# Patient Record
Sex: Female | Born: 1990 | Race: White | Hispanic: No | Marital: Married | Smoking: Never smoker
Health system: Southern US, Community
[De-identification: ages and names within clinical notes are randomized; demographics above are authoritative.]

## PROBLEM LIST (undated history)

## (undated) DIAGNOSIS — G90A Postural orthostatic tachycardia syndrome (POTS): Secondary | ICD-10-CM

## (undated) DIAGNOSIS — I498 Other specified cardiac arrhythmias: Secondary | ICD-10-CM

## (undated) DIAGNOSIS — J45909 Unspecified asthma, uncomplicated: Secondary | ICD-10-CM

## (undated) HISTORY — DX: Other specified cardiac arrhythmias: I49.8

## (undated) HISTORY — DX: Postural orthostatic tachycardia syndrome (POTS): G90.A

## (undated) HISTORY — DX: Unspecified asthma, uncomplicated: J45.909

---

## 2018-09-24 IMAGING — DX XR LUMBAR SPINE 2-3 VIEWS
1 series · 3 of 3 positions shown · non-contrast
Comparison: none

LUMBAR SPINE RADIOGRAPH
INDICATION: lumbago

[Series 1254: AP · U · 0.19mm/px · 3 of 3 slices shown]
[im 1/3]
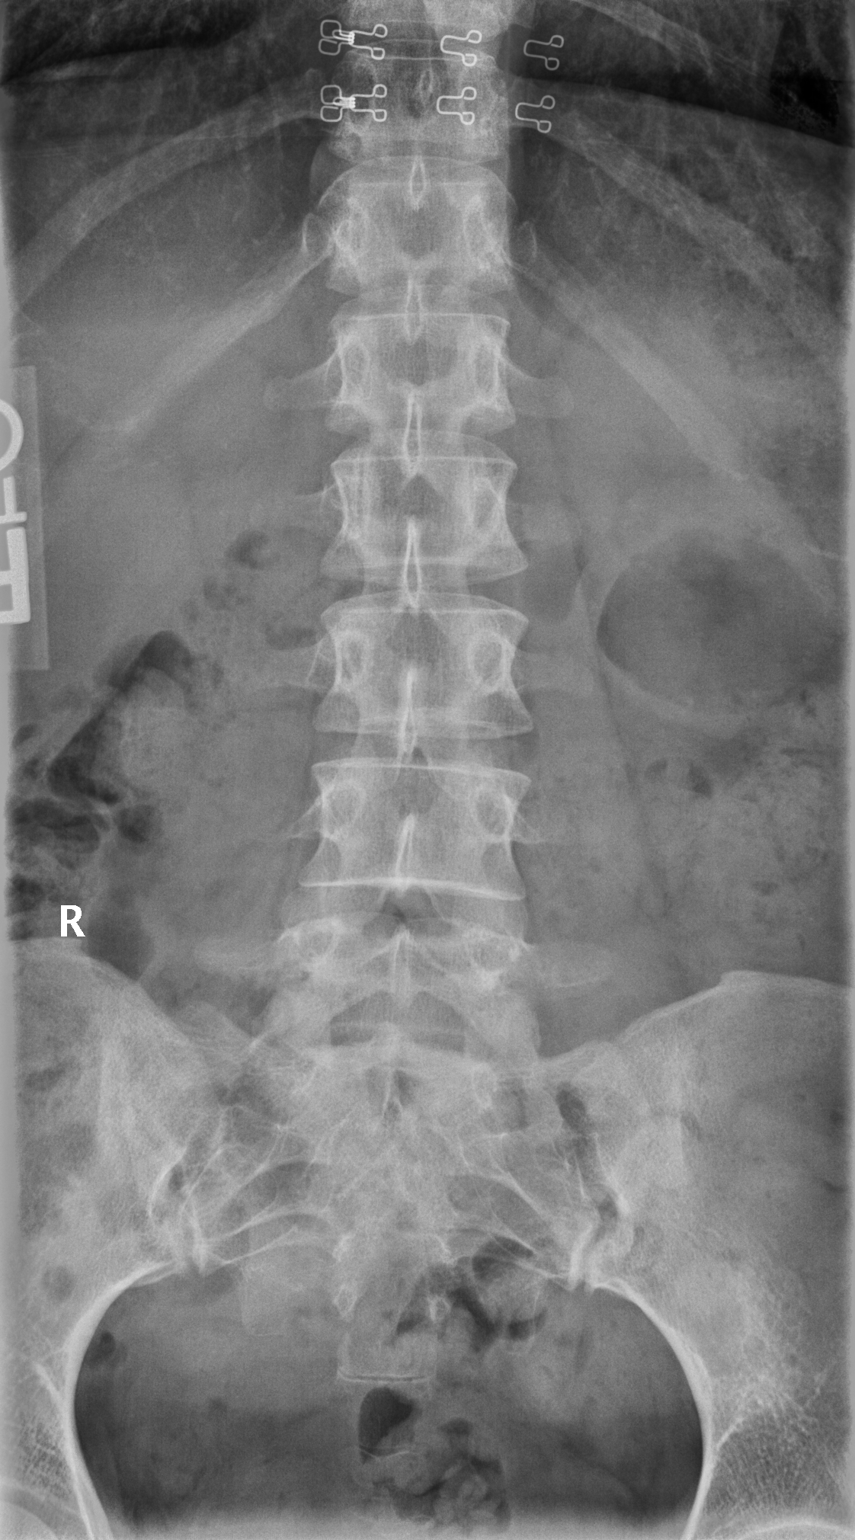
[im 2/3]
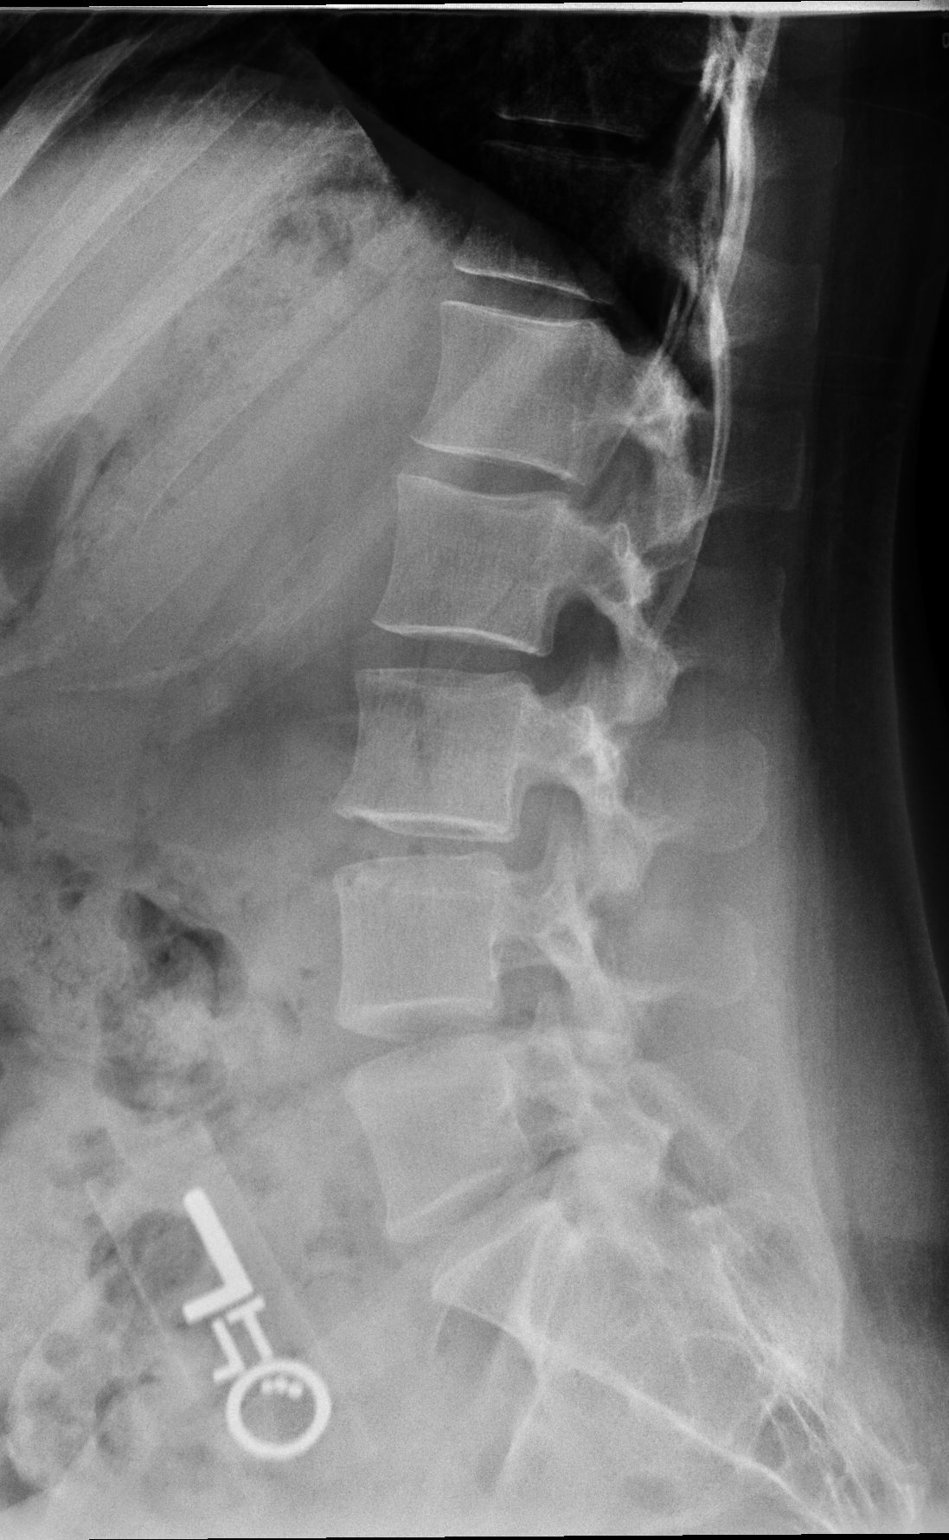
[im 3/3]
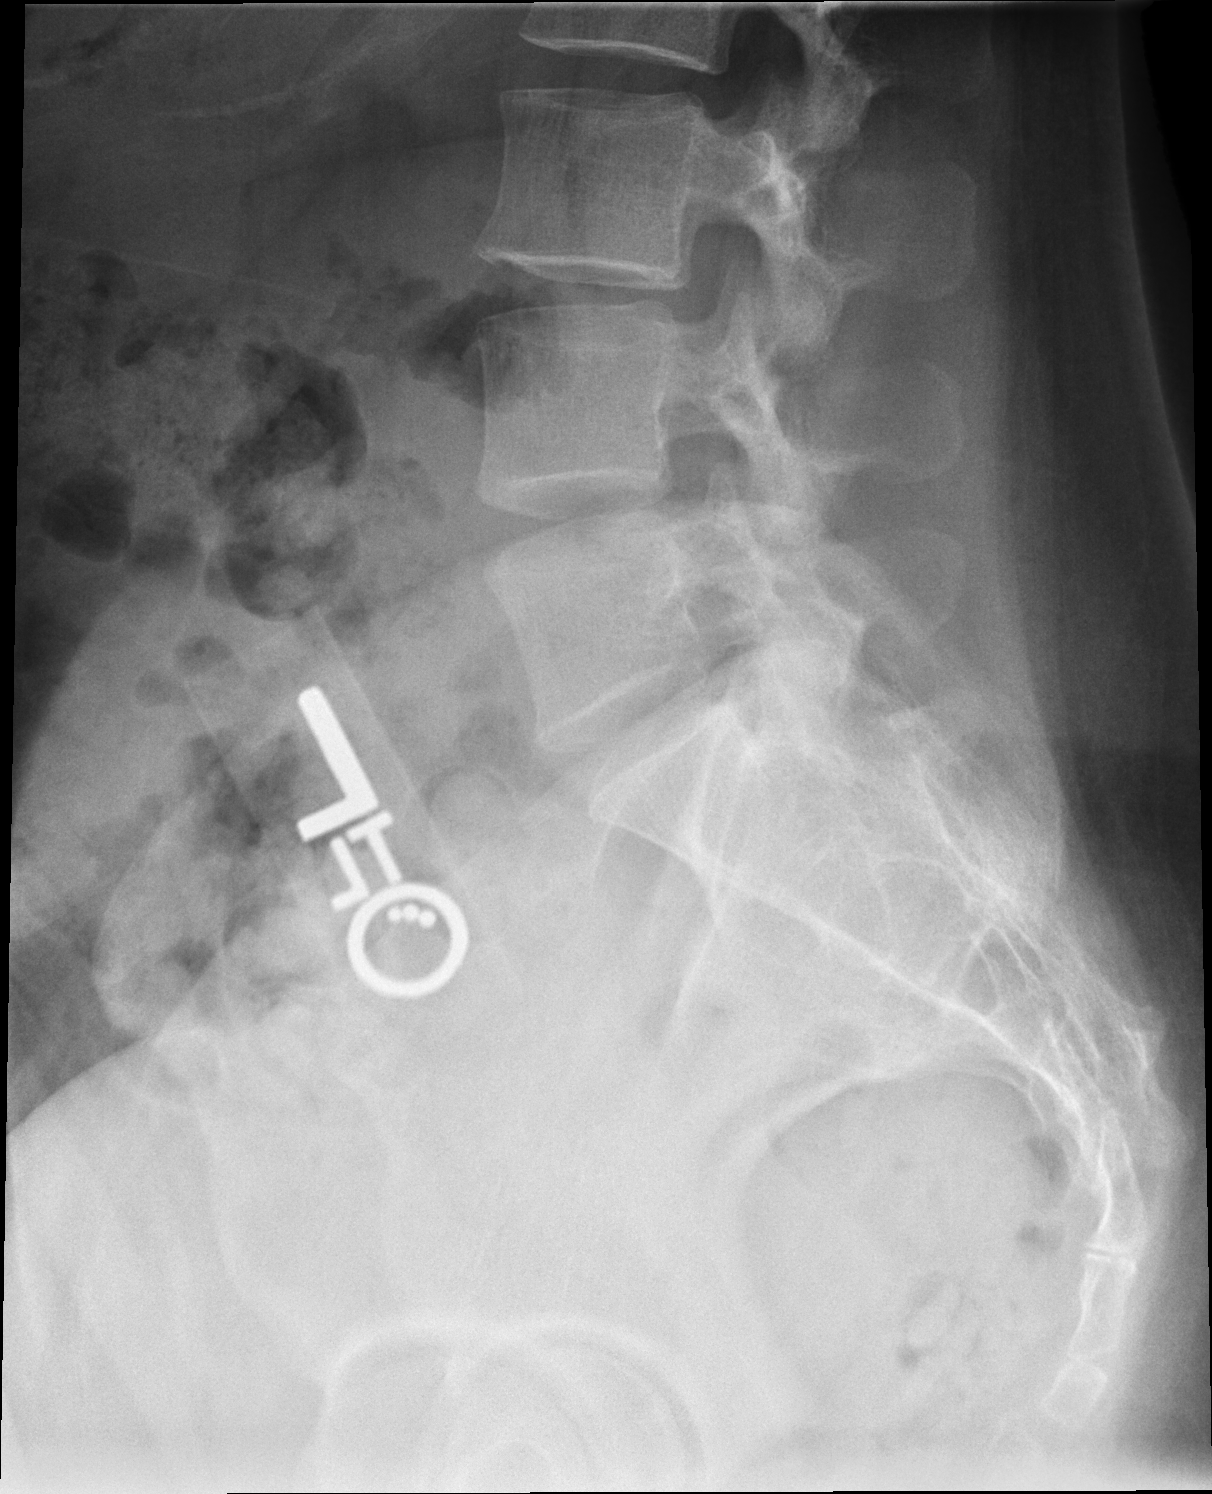

[3 of 3 positions shown; findings below may reference images not displayed]

FINDINGS: AP and lateral views of the lumbosacral spine are obtained and submitted for review. The vertebral body heights are maintained; no evidence for acute fractures. No subluxation. Disc space heights are maintained. Paraspinal soft tissues are radiographically unremarkable.
IMPRESSION: No acute or suspicious osseous abnormality.
Location code: 1
Is the patient pregnant?
No

## 2018-09-28 IMAGING — DX XR SCOLIOSIS 1 VW
1 series · 4 of 4 positions shown · non-contrast
Comparison: 11/09/2013, 09/24/2018.

SCOLIOSIS SERIES:
CLINICAL INDICATION: mild thoracic dextroscoliosis, eval for degree of curvature. also w/ mid thoracic back pain

[Series 1328: PA · U · 0.19mm/px · 4 of 4 slices shown]
[im 1/4]
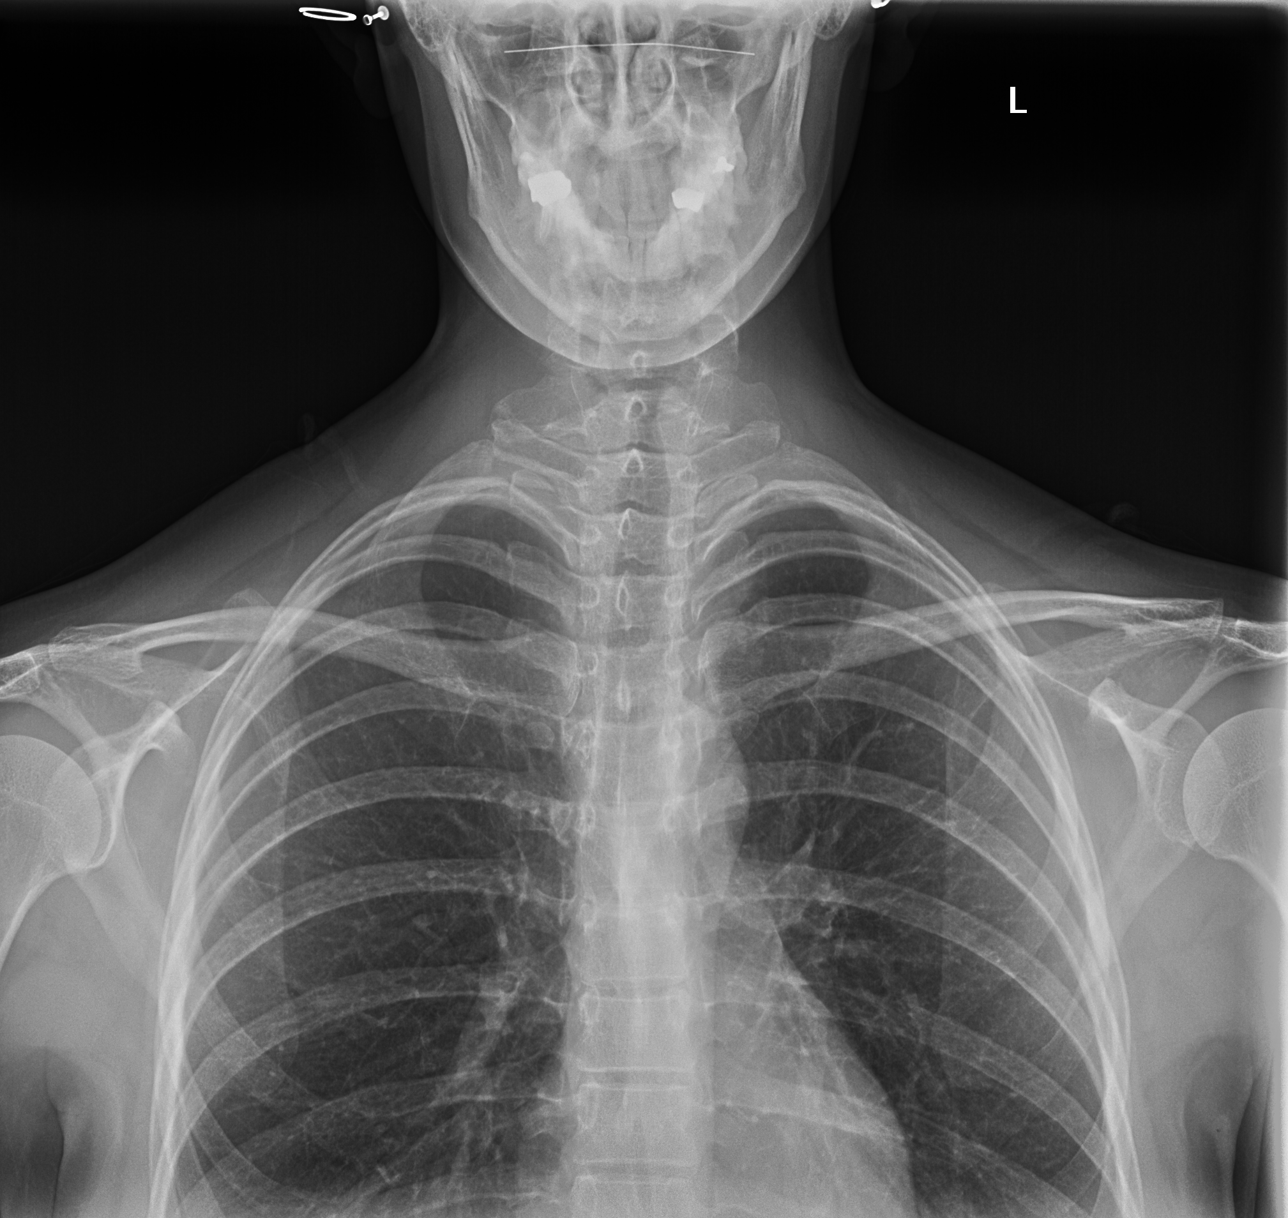
[im 2/4]
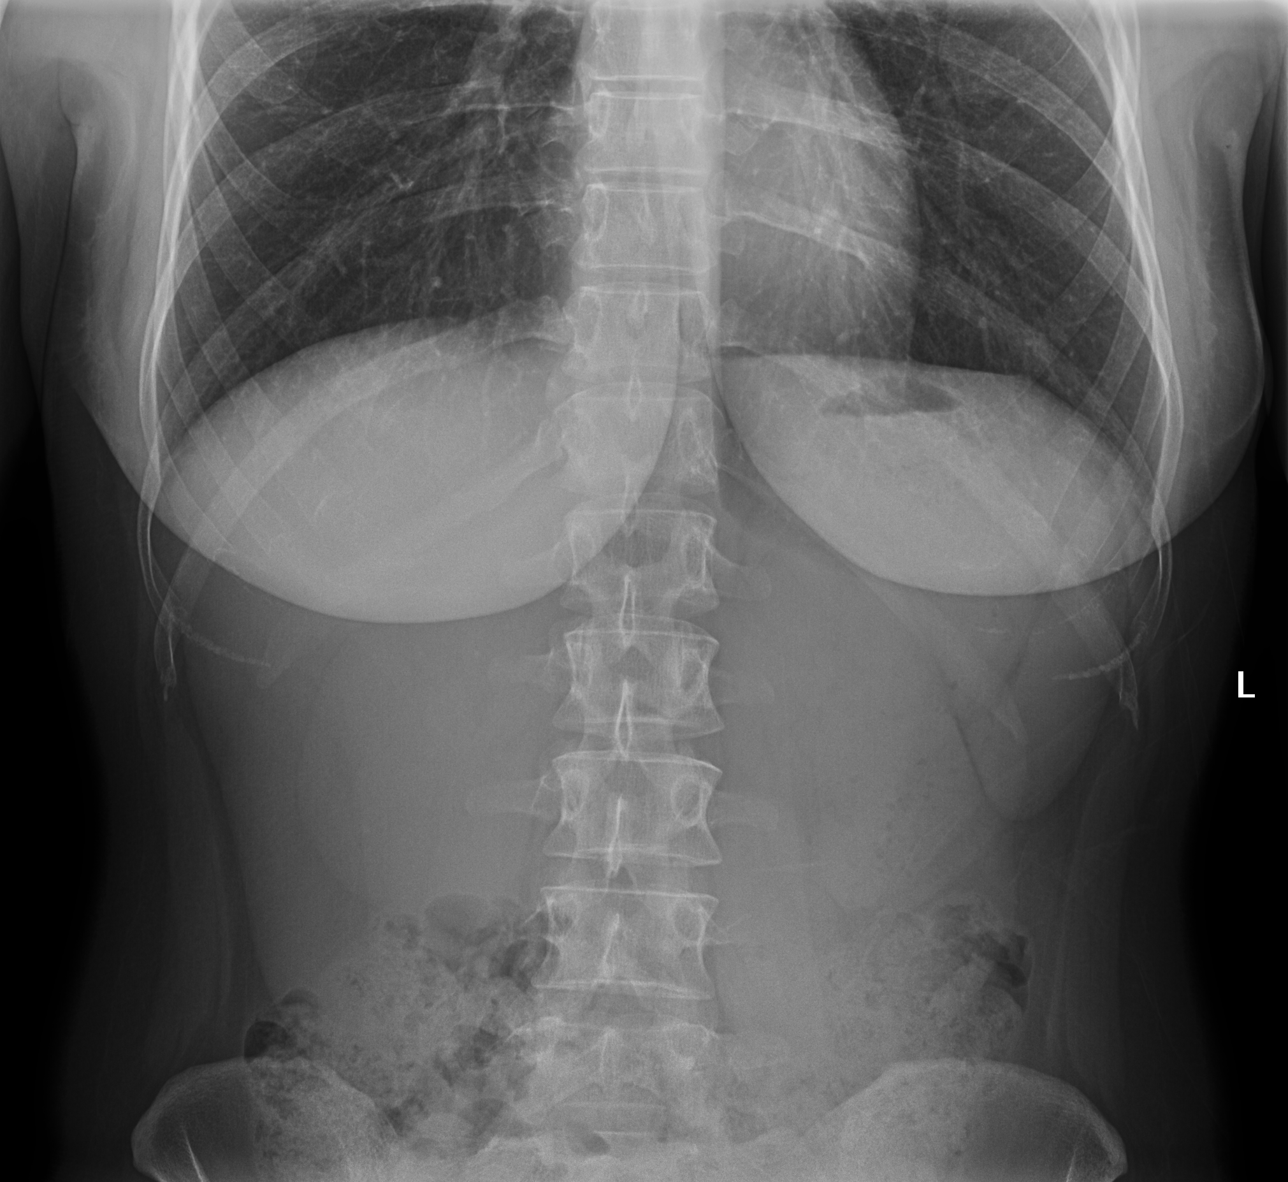
[im 3/4]
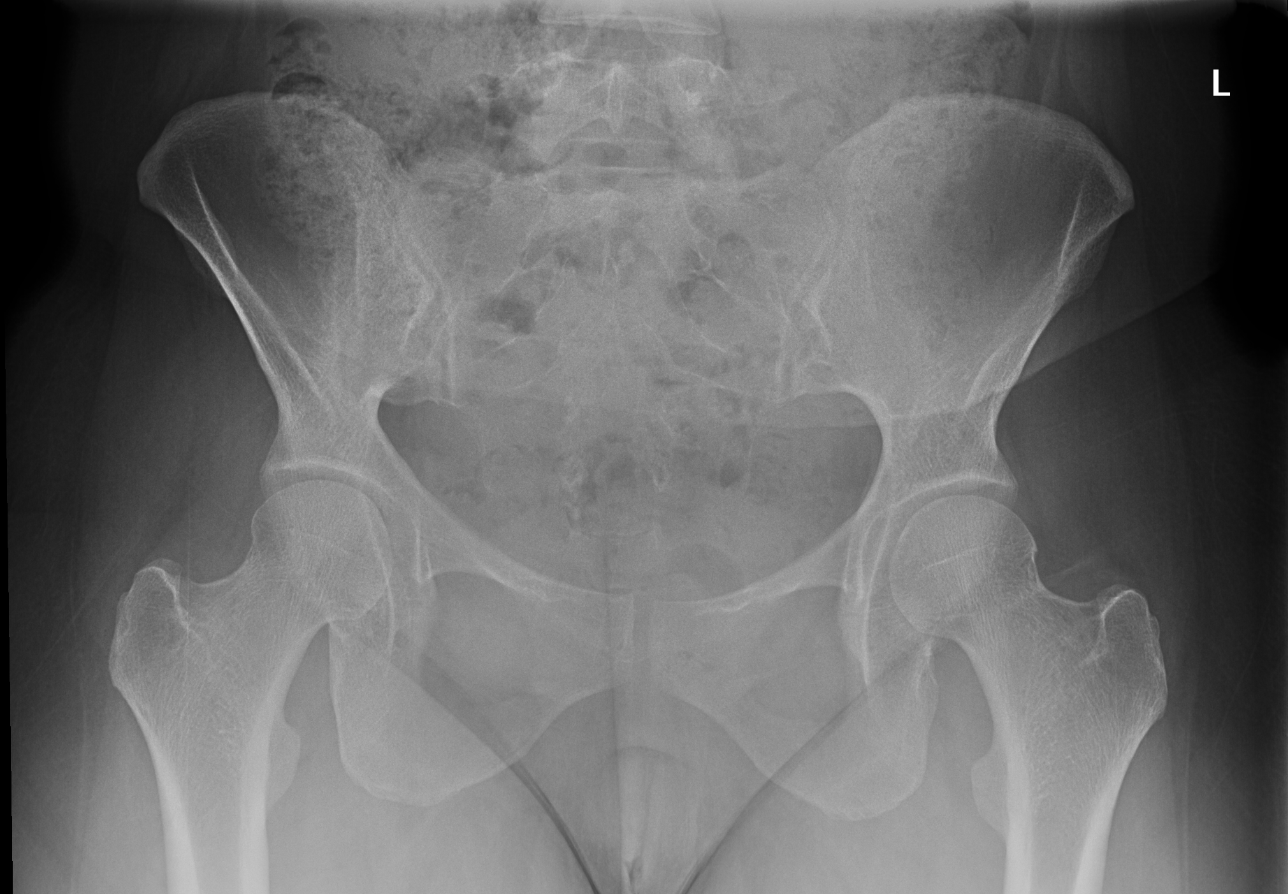
[im 4/4]
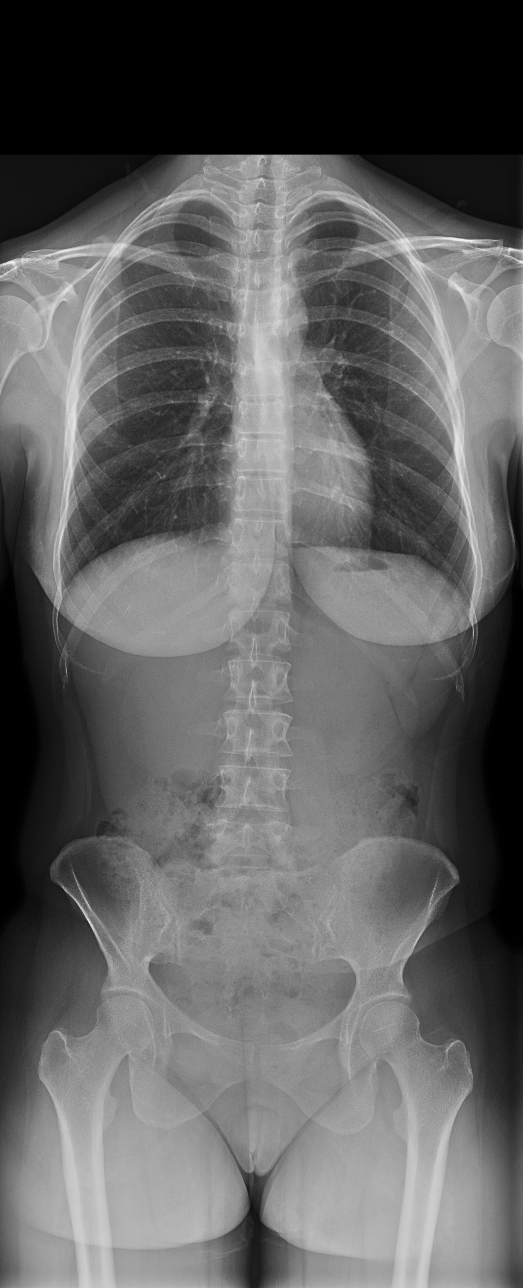

[4 of 4 positions shown; findings below may reference images not displayed]

FINDINGS: Standing AP views of the thoracic and lumbar spines were obtained.
There is normal alignment on standing AP views. No significant spondylotic changes.
The surrounding soft tissues and osseous structures are unremarkable.
IMPRESSION: No evidence of scoliosis.
Location code : 1
Is the patient pregnant?
No

## 2019-12-03 ENCOUNTER — Emergency Department
Admission: EM | Admit: 2019-12-03 | Discharge: 2019-12-03 | Disposition: A | Discharge status: Home / Self Care | Payer: Commercial Managed Care - POS | Attending: Emergency Medicine | Admitting: Emergency Medicine

## 2019-12-03 ENCOUNTER — Emergency Department: Payer: Commercial Managed Care - POS

## 2019-12-03 DIAGNOSIS — J45909 Unspecified asthma, uncomplicated: Secondary | ICD-10-CM | POA: Insufficient documentation

## 2019-12-03 DIAGNOSIS — F1290 Cannabis use, unspecified, uncomplicated: Secondary | ICD-10-CM

## 2019-12-03 LAB — GLUCOSE WHOLE BLOOD - POCT: Whole Blood Glucose POCT: 101 mg/dL — ABNORMAL HIGH (ref 70–100)

## 2019-12-03 MED ORDER — SODIUM CHLORIDE 0.9 % IV BOLUS
1000.00 mL | Freq: Once | INTRAVENOUS | Status: AC
Start: 2019-12-03 — End: 2019-12-03
  Administered 2019-12-03: 06:00:00 1000 mL via INTRAVENOUS

## 2019-12-03 NOTE — ED Triage Notes (Signed)
Pt BIBA after smoking marijuana vape pen. Says she feels like her heart is racing and that she had too much. Pt is tachycardic 130's with BP 111/79. Pt is AOx4 and talkative.

## 2019-12-03 NOTE — ED Notes (Signed)
Bed: PU31  Expected date:   Expected time:   Means of arrival:   Comments:

## 2019-12-03 NOTE — ED Notes (Signed)
Ambulated without difficulty.

## 2019-12-03 NOTE — ED Provider Notes (Signed)
EMERGENCY DEPARTMENT HISTORY AND PHYSICAL EXAM     None        Date: 12/03/2019  Patient Name: Melody Lang    History of Presenting Illness     Chief Complaint   Patient presents with    Drug Overdose         Additional History: Melody Lang is a 29 y.o. female presenting to the ED with palpitations, unable to move after smoking her THC vape pen.  Patient states she has used it before but use it more tonight.  Is visiting from [place].  Called EMS to her hotel.  Per EMS, patient's heart rate in the 150s.  Patient reports a history of pots and cervical stenosis, requesting to have her "nerves checked"      PCP: No primary care provider on file.  SPECIALISTS:    Current Facility-Administered Medications   Medication Dose Route Frequency Provider Last Rate Last Admin    sodium chloride 0.9 % bolus 1,000 mL  1,000 mL Intravenous Once Makynzie Dobesh, Rochel Brome, MD 1,000 mL/hr at 12/03/19 0600 1,000 mL at 12/03/19 0600     Current Outpatient Medications   Medication Sig Dispense Refill    DULoxetine (CYMBALTA) 20 MG capsule Take 20 mg by mouth daily      fluticasone (FLONASE) 50 MCG/ACT nasal spray 1 spray by Nasal route daily      loratadine (CLARITIN) 10 MG tablet Take 10 mg by mouth daily      sertraline (ZOLOFT) 100 MG tablet Take 100 mg by mouth daily         Past History     Past Medical History:  Past Medical History:   Diagnosis Date    Asthma     POTS (postural orthostatic tachycardia syndrome)        Past Surgical History:  Past Surgical History:   Procedure Laterality Date    BREAST SURGERY      NERVE REPAIR         Family History:  History reviewed. No pertinent family history.    Social History:  Social History     Tobacco Use    Smoking status: Never Smoker    Smokeless tobacco: Never Used   Haematologist Use: Never used   Substance Use Topics    Alcohol use: Never    Drug use: Yes     Comment: marijuana       Allergies:  Allergies   Allergen Reactions    Ceclor [Cefaclor]         Review of Systems     Review of Systems   Constitutional: Negative for activity change, chills and fever.   HENT: Negative for congestion and sore throat.    Eyes: Negative for pain and redness.   Respiratory: Positive for chest tightness. Negative for cough and shortness of breath.    Cardiovascular: Positive for palpitations. Negative for chest pain.   Gastrointestinal: Negative for abdominal pain, diarrhea, nausea and vomiting.   Genitourinary: Negative for dysuria and hematuria.   Musculoskeletal: Negative for arthralgias and myalgias.   Skin: Negative for pallor and rash.   Neurological: Negative for light-headedness and headaches.   Psychiatric/Behavioral: Negative for suicidal ideas. The patient is not nervous/anxious.    All other systems reviewed and are negative.      Physical Exam   BP 111/79    Pulse (!) 142    Temp 98.7 F (37.1 C)    Resp 20  Ht 5\' 5"  (1.651 m)    Wt 59 kg    SpO2 100%    BMI 21.63 kg/m     Physical Exam  Vitals and nursing note reviewed.   Constitutional:       Appearance: She is well-developed.      Comments: Appears intoxicated, repetitive   HENT:      Head: Normocephalic and atraumatic.   Eyes:      Pupils: Pupils are equal, round, and reactive to light.   Cardiovascular:      Rate and Rhythm: Regular rhythm. Tachycardia present.      Heart sounds: Normal heart sounds.   Pulmonary:      Effort: Pulmonary effort is normal.      Breath sounds: Normal breath sounds.   Abdominal:      General: Bowel sounds are normal.      Palpations: Abdomen is soft.      Tenderness: There is no abdominal tenderness. There is no rebound.   Musculoskeletal:         General: No deformity. Normal range of motion.      Cervical back: Normal range of motion and neck supple.   Skin:     General: Skin is warm and dry.      Capillary Refill: Capillary refill takes less than 2 seconds.   Neurological:      General: No focal deficit present.      Mental Status: She is alert and oriented to person,  place, and time.      Cranial Nerves: No cranial nerve deficit.   Psychiatric:         Behavior: Behavior normal.         Diagnostic Study Results     Labs -     Results     ** No results found for the last 24 hours. **          Radiologic Studies -   Radiology Results (24 Hour)     Procedure Component Value Units Date/Time    XR Chest  AP Portable [[id number]] Collected: 12/03/19 0610    Order Status: Completed Updated: 12/03/19 0612    Narrative:      HISTORY:Tachycardia.    COMPARISON:  None.     TECHNIQUE: XR CHEST AP PORTABLE     FINDINGS:    Lines and tubes: None    Lungs and hila: The lungs are clear. There is no pleural effusion. There  is no pneumothorax.    Heart and Mediastinum: Normal     Bones and soft tissues: No acute abnormality.    Upper abdomen: Normal      Impression:        No acute process.          Jorene Guest, MD   12/03/2019 6:10 AM      .    Medical Decision Making   I am the first provider for this patient.    I reviewed the vital signs, available nursing notes, past medical history, past surgical history, family history and social history.    Vital Signs-Reviewed the patient's vital signs.     Patient Vitals for the past 12 hrs:   BP Temp Pulse Resp   12/03/19 0613 -- 98.7 F (37.1 C) -- 20   12/03/19 0600 111/79 -- (!) 142 20   12/03/19 0539 128/79 -- (!) 147 (!) 27       Pulse Oximetry Analysis -100% on room air, normal  Cardiac Monitor: Sinus tachycardia, rate 146    EKG:  Interpreted by the EP.   Time Interpreted: 0536   Rate: 148   Rhythm: Sinus Tachycardia    Interpretation: Normal axis, normal intervals, no significant ST elevations or depressions       Procedures:    Clinical Decision Support:               Provider Notes: Tachycardia after smoking a THC vape pen.  Likely side effect of vape pen.  Do not suspect ACS or PE.  Will give IV fluids and discharge when back to baseline and tachycardia improved  ED Course as of Dec 02 641   Fri Dec 03, 2019   0615 Signed out to Dr. Tresea Mall  pending reassessment    [MA]      ED Course User Index  [MA] Ronisha Herringshaw, Rochel Brome, MD         Diagnosis     Clinical Impression:   1. Marijuana use        Treatment Plan:   ED Disposition     None               Qusai Kem, Rochel Brome, MD  12/03/19 [phone]

## 2019-12-03 NOTE — ED Notes (Signed)
Assumed care of the patient, sleeping present

## 2019-12-03 NOTE — ED Notes (Signed)
Bed: GR15  Expected date:   Expected time:   Means of arrival:   Comments:  Medic 206

## 2019-12-13 NOTE — ED Provider Notes (Signed)
SIGN-OUT RECEIVED 1:48 PM   I assumed care of this patient from Dr. Tasia Catchings    Bedside rounds were done.    I evaluated the patient. On exam patient felt significant improvement was not having any nausea or vomiting.  Patient comfortable being discharged patient will follow up with her primary care doctor and was given return precautions..    _____    Reassessment Time:       Vitals:    12/03/19 1130   BP: 118/78   Pulse:    Resp:    Temp:    SpO2: 98%     Results were reviewed.     I informed the patient of findings and treatment plan was discussed.    Reassessment:  Results     Procedure Component Value Units Date/Time    Glucose Whole Blood - POCT [[id number]]  (Abnormal) Collected: 12/03/19 0540     Updated: 12/03/19 0924     Whole Blood Glucose POCT 101 mg/dL         Chart reconciliation: Dr. Tasia Catchings was the primary emergency physician of record.    1. Marijuana use        ED Disposition     ED Disposition Condition Date/Time Comment    Discharge  Fri Dec 03, 2019 11:30 AM Melody Lang discharge to home/self care.    Condition at disposition: Stable             Dereck Leep, MD  12/13/19 1521

## 2020-07-31 IMAGING — RF FL ESOPHAGUS BARIUM SWALLOW
8 of 9 series · 14 of 24 positions shown · non-contrast
Comparison: none

Reflux,dysphagia w pills,ck for Damayantha Dil, no EGD,pt hx: Ehlers Danlos Syndrome
BARIUM SWALLOW:
CLINICAL INDICATION:  Reflux symptoms. Dysphagia with pills. Possible syncopal diverticulum. Ehlers Danlos syndrome.
TECHNIQUE: Real-time fluoroscopic observation was performed during the oral administration of barium and effervescent crystals.  Spot acquisition films were obtained for archive.  A 13-mm radiopaque barium pill was administered to evaluate for strictures.
TOTAL FLUOROSCOPY TIME:  0.5 minutes
FLUOROSCOPIC IMAGES:  54
DOSE: 6.28 mGy

[Series 1: esophogr 1 · 1 of 7 frames shown]
[frame 2/7]
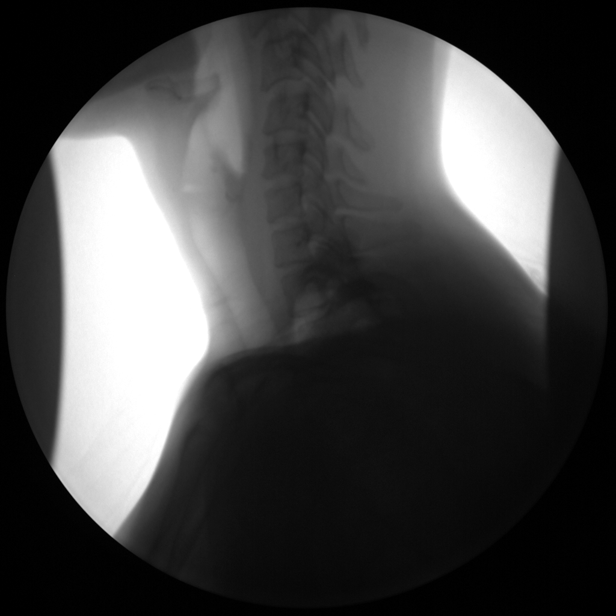

[Series 2: esophogr 2 · 4 acquisitions, 2 frames shown]
[im 1/4]
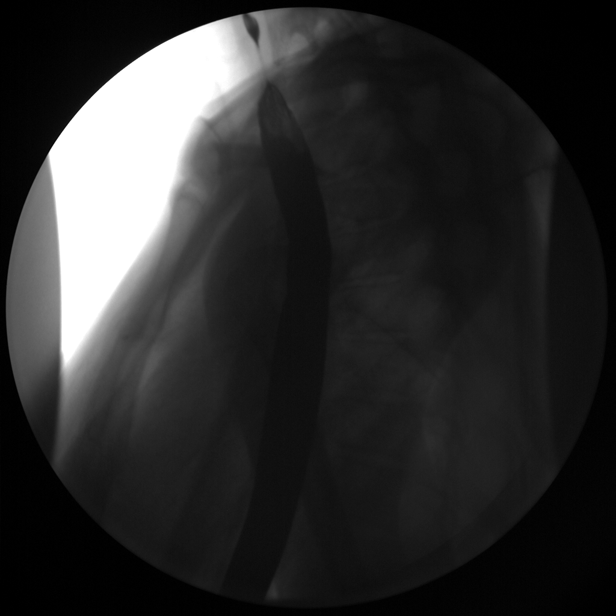
[im 4/4]
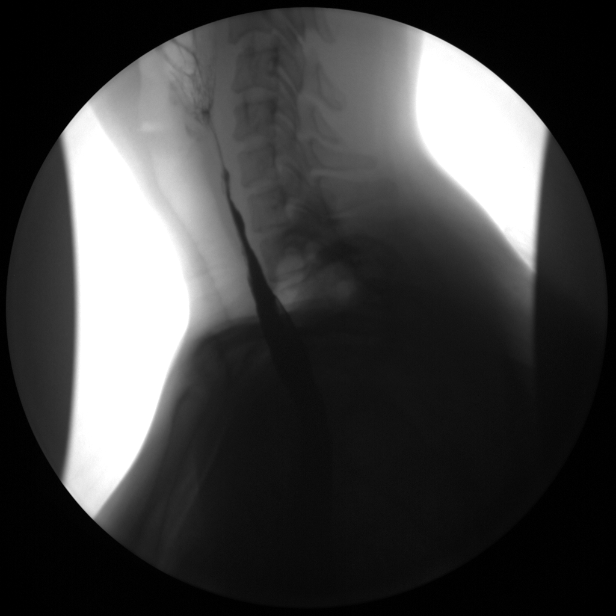

[Series 3: esophogr 3 · 3 acquisitions, 2 frames shown]
[im 3/3]
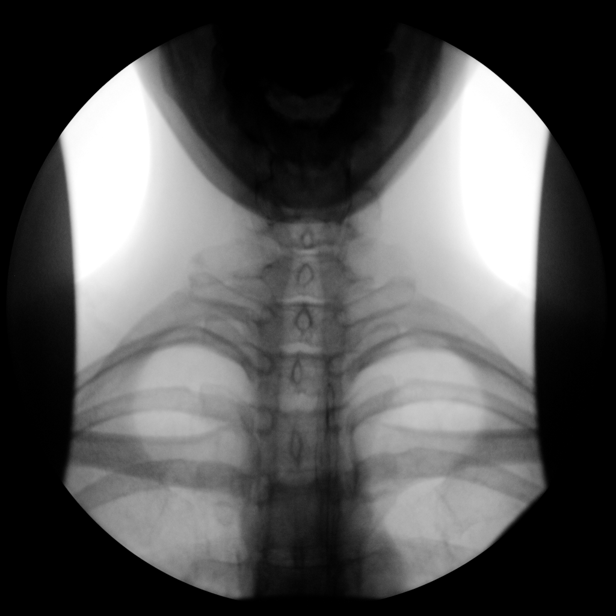
[im 3/3]
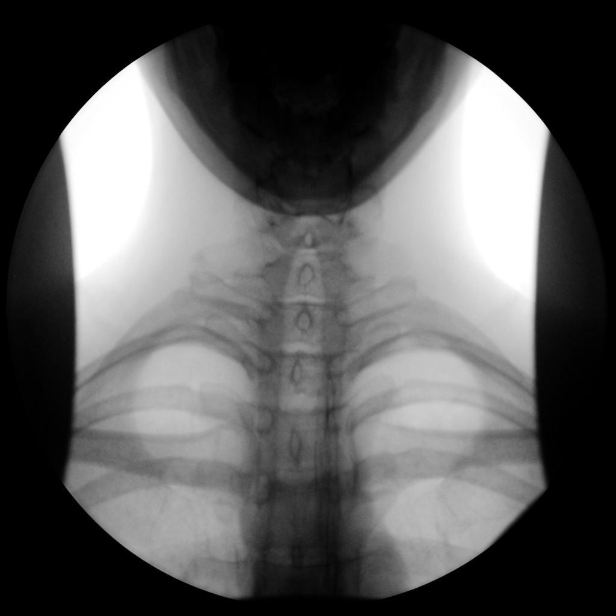

[Series 4: esophogr 4 · 3 acquisitions, 2 frames shown]
[im 2/3]
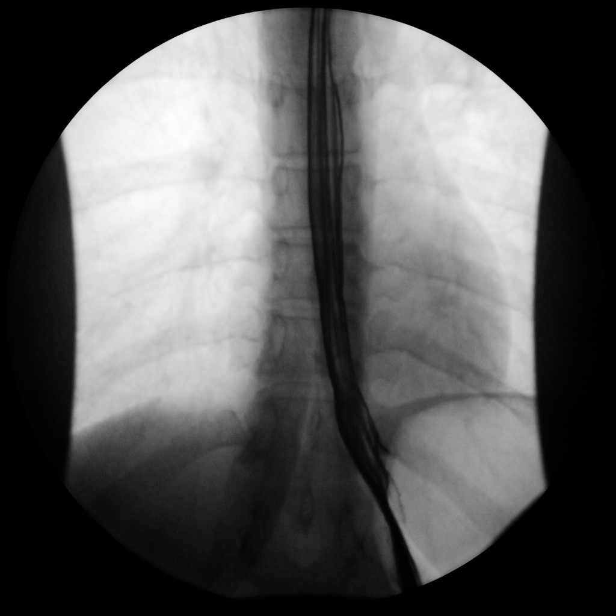
[im 3/3]
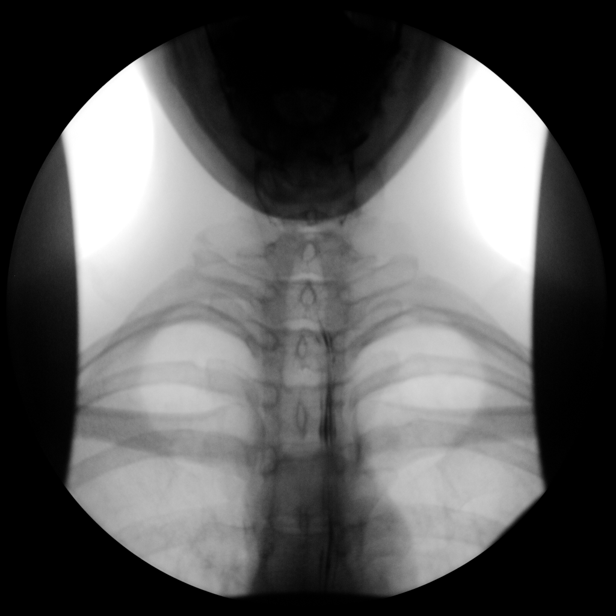

[Series 5: esophogr 5 · 8 acquisitions, 3 frames shown]
[im 1/8]
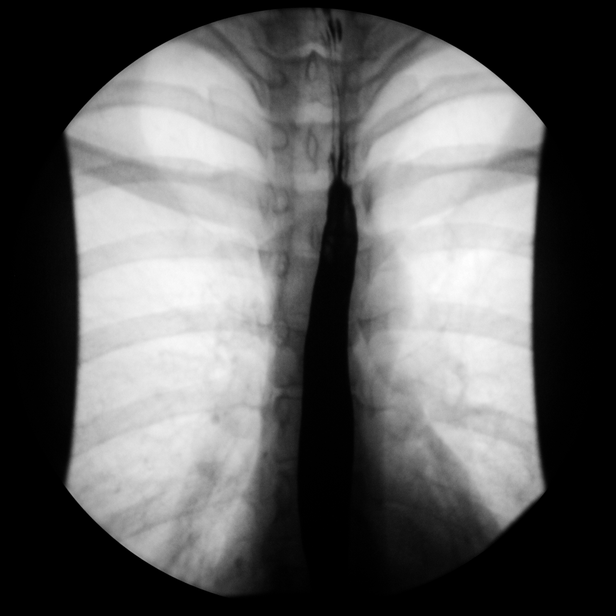
[im 4/8]
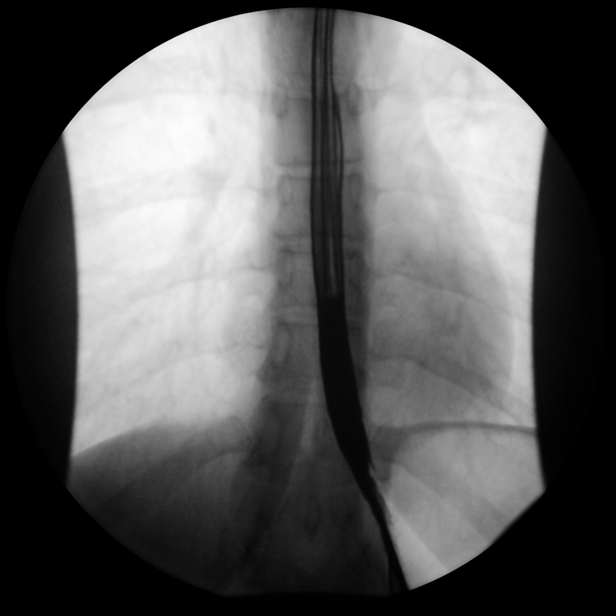
[im 8/8]
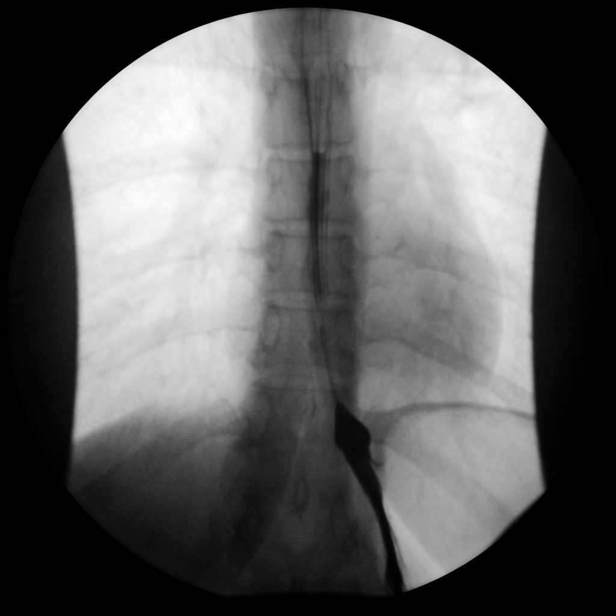

[Series 6: esophogr 6 · 4 acquisitions, 2 frames shown]
[im 4/4]
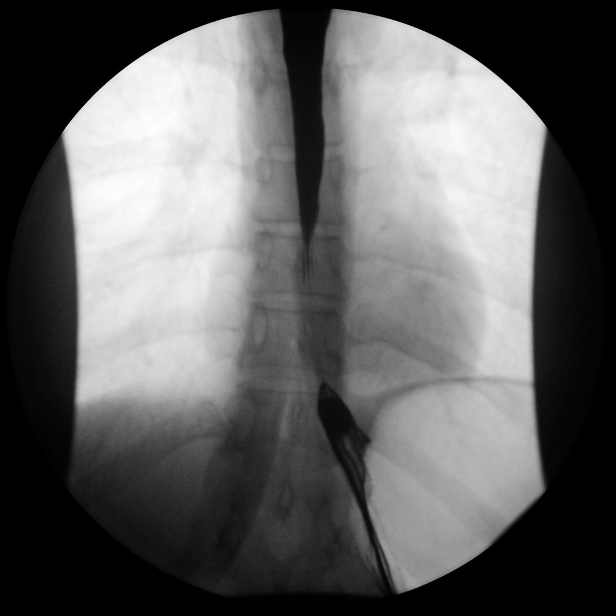
[im 4/4]
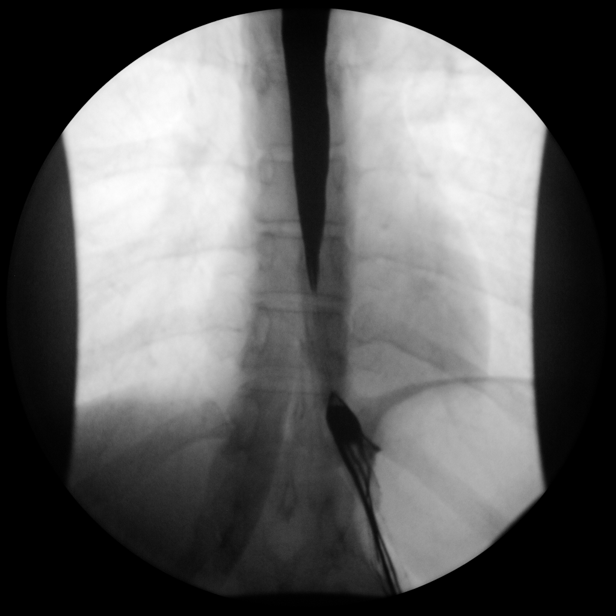

[Series 8: esophogr 8 · 1 of 2 frames shown]
[frame 2/2]
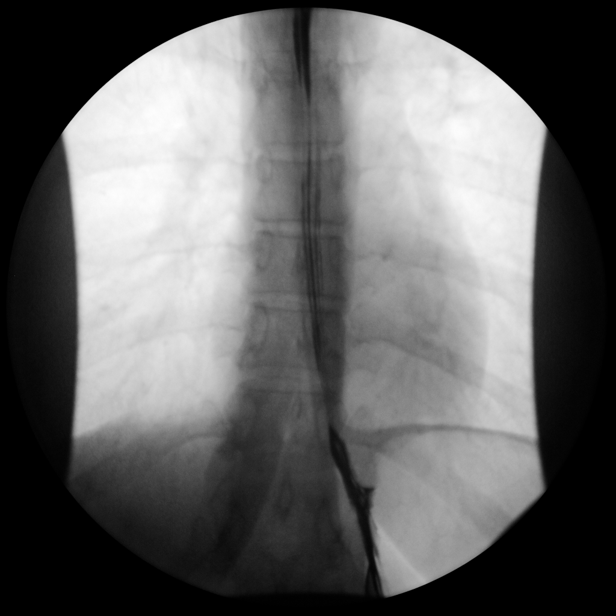

[Series 9: esophogr 9 · 1 of 3 slices shown]
[im 3/3]
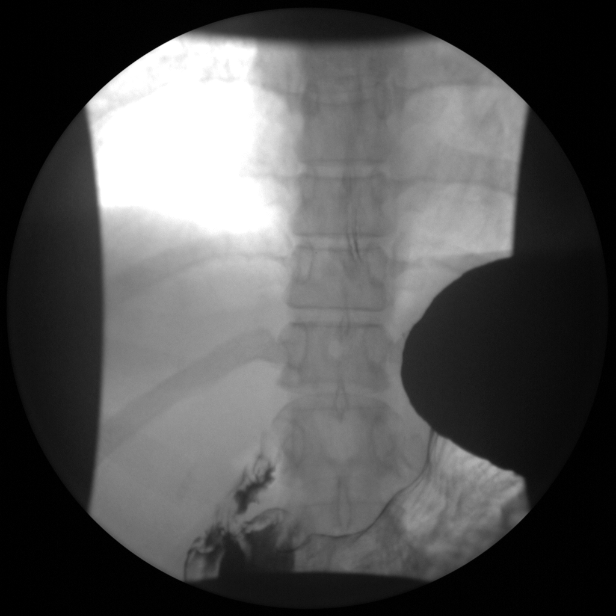

[14 of 24 positions shown; findings below may reference images not displayed]

FINDINGS: There is a normal oral and pharyngeal phase of swallowing. There is no laryngeal penetration, vallecular pooling or aspiration of barium.
Esophageal course, caliber and peristalsis are normal. There are no tertiary contractions. There are no intraluminal filling defects, extrinsic or constricting masses or mucosal ulcerations.
Barium passes easily through the gastroesophageal junction into the stomach without significant delay.  The 13 mm radiopaque barium pill passes easily from the esophagus to the gastric lumen without delay. There are no visible strictures.
There is no visible hiatal hernia. There is no gastroesophageal reflux.
IMPRESSION: Normal barium swallow.
LOCATION CODE: 1
Is the patient pregnant?
No

## 2020-08-08 IMAGING — MR MRI THORACIC SPINE WITHOUT CONTRAST
10 of 15 series · 24 of 48 positions shown · non-contrast
Comparison: none

EXAM: MRI THORACIC SPINE WITHOUT CONTRAST
TECHNIQUE: Sagittal T1, T2, STIR and axial fast field echo T2 images of the thoracic spine are obtained without contrast.
CLINICAL HISTORY: Pain in the thoracic spine

[Series 5: survey · coronal · 1.7mm · 1.67mm/px · 11 of 127 slices shown]
[im 1/127]
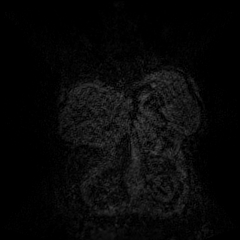
[im 13/127]
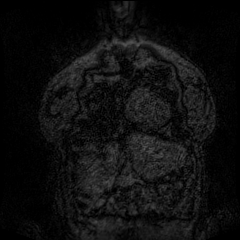
[im 26/127]
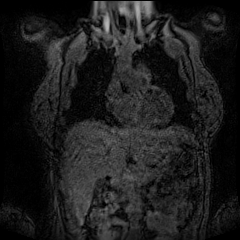
[im 38/127]
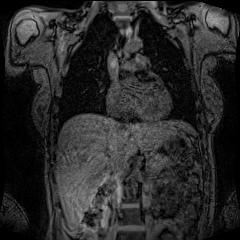
[im 51/127]
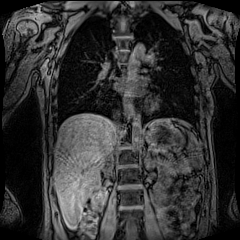
[im 64/127]
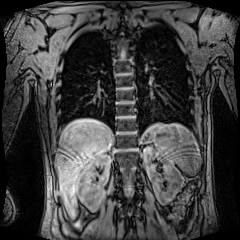
[im 76/127]
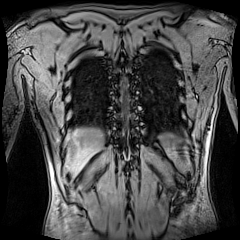
[im 89/127]
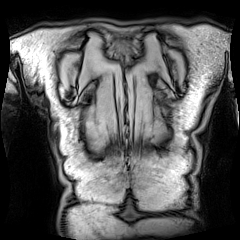
[im 101/127]
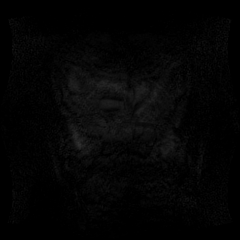
[im 114/127]
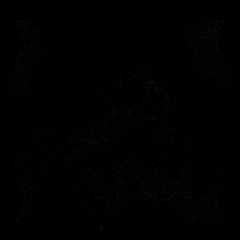
[im 127/127]
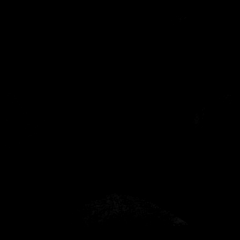

[Series 7: survey_mpr_sag · sagittal · 1.7mm · 1.67mm/px · 1 of 15 slices shown]
[im 1/15]
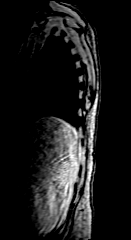

[Series 8: survey_mpr_(person_name) · axial · 1.7mm · 1.67mm/px · 1 of 7 slices shown]
[im 1/7]
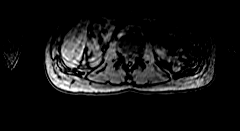

[Series 12: T2 · sagittal · 4.0mm · 0.76mm/px · 1 of 15 slices shown (1 of 3)]
[im 1/15]
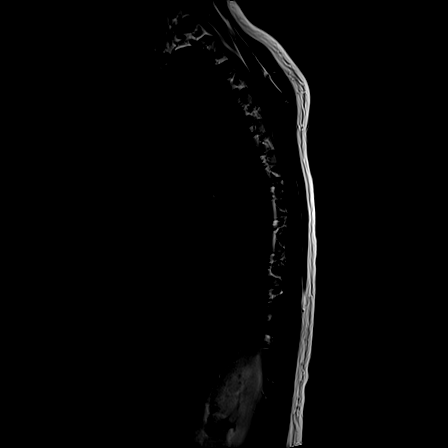

[Series 13: T1 · sagittal · 4.0mm · 0.89mm/px · 1 of 15 slices shown]
[im 1/15]
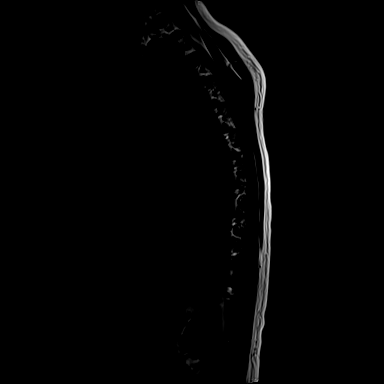

[Series 14: STIR · sagittal · 4.0mm · 0.89mm/px · 1 of 15 slices shown]
[im 1/15]
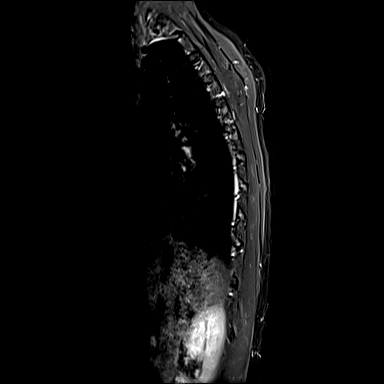

[Series 15: GRE · axial · 7.0mm · 0.78mm/px · z∈[-179,+3]mm · 2 of 24 slices shown (1 of 2)]
[im 1/24]
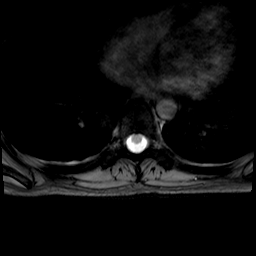
[im 24/24]
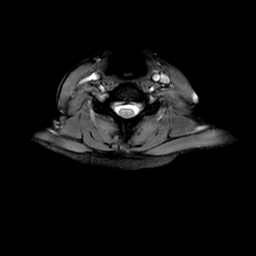

[Series 16: T2 · axial · 7.0mm · 0.62mm/px · z∈[-179,+3]mm · 2 of 24 slices shown (2 of 3)]
[im 1/24]
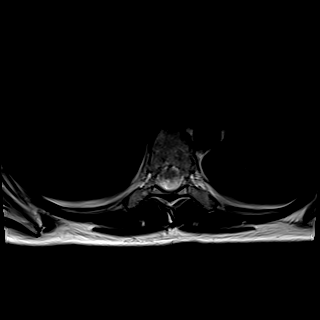
[im 24/24]
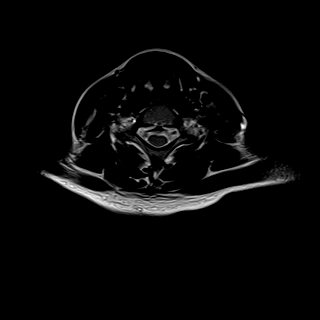

[Series 17: GRE · axial · 7.0mm · 0.78mm/px · z∈[-289,-104]mm · 2 of 24 slices shown (2 of 2)]
[im 1/24]
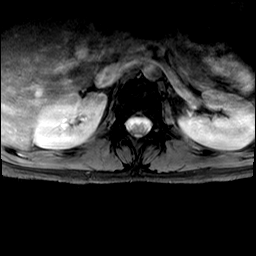
[im 24/24]
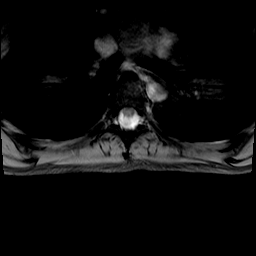

[Series 18: T2 · axial · 7.0mm · 0.62mm/px · z∈[-289,-104]mm · 2 of 24 slices shown (3 of 3)]
[im 1/24]
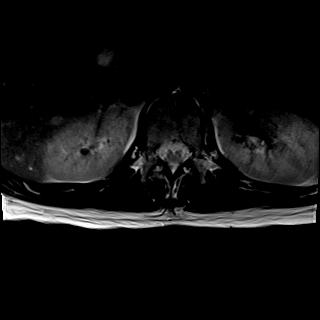
[im 24/24]
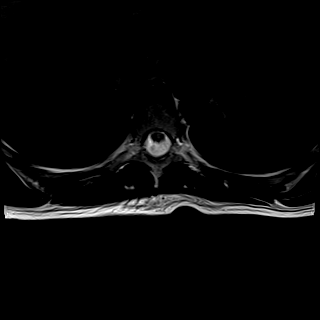

[24 of 48 positions shown; findings below may reference images not displayed]

FINDINGS: Alignment of vertebral bodies is normal. Intervertebral discs as well as vertebral bodies maintain height within the range for normal. The spinal cord maintains normal caliber contour and has normal signal.
From C7-T1 through T7-8, there is no evidence for disc extrusion or disc protrusion of significance. There are no levels of significant canal or neural foraminal stenosis.
At T8-9 through T12-L1, there is no evidence for significant canal, lateral recess or neural foraminal stenosis.
The visualized paravertebral soft tissues appear within the range for normal.
IMPRESSION: No levels of significant canal or neural foraminal stenosis as outlined above.
Location 1
Is the patient pregnant?
No

## 2020-09-12 IMAGING — US US PELVIS TRANSVAGINAL
1 series · 14 of 25 positions shown · non-contrast
Comparison: none

This is a summary report. The complete report is available in the patient's medical record. If you cannot access the medical record, please contact the sending organization for a detailed fax or copy.
INDICATION: Uterine prolapse
Ultrasound demonstrated uterus that measures 8.75 x 4.38 x 6.07cm.  Endometrium measures .30cm.  Right ovary measures 3.59 x 2.24 x 1.33cm.  Left ovary measures 3.99 x 2.72 x 1.70cm. Follicles seen bilaterally.  No free fluid seen within the pelvis.

[Series 1: us pelvis transvaginal · 14 of 37 slices shown]
[im 1/37]
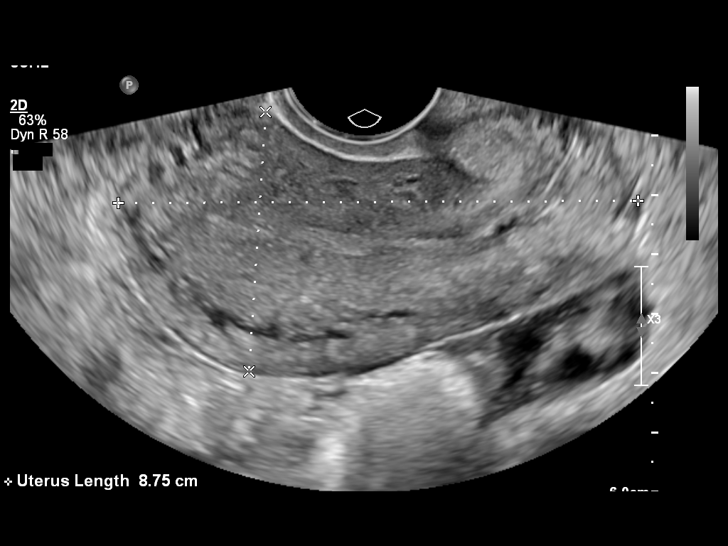
[im 4/37]
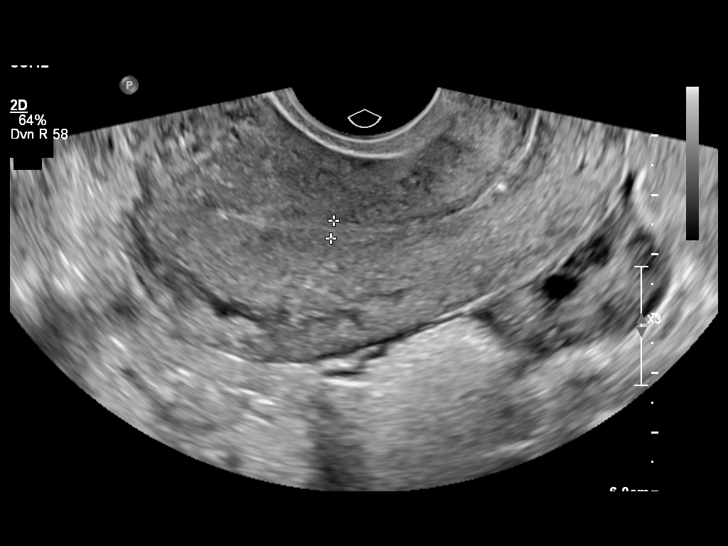
[im 7/37]
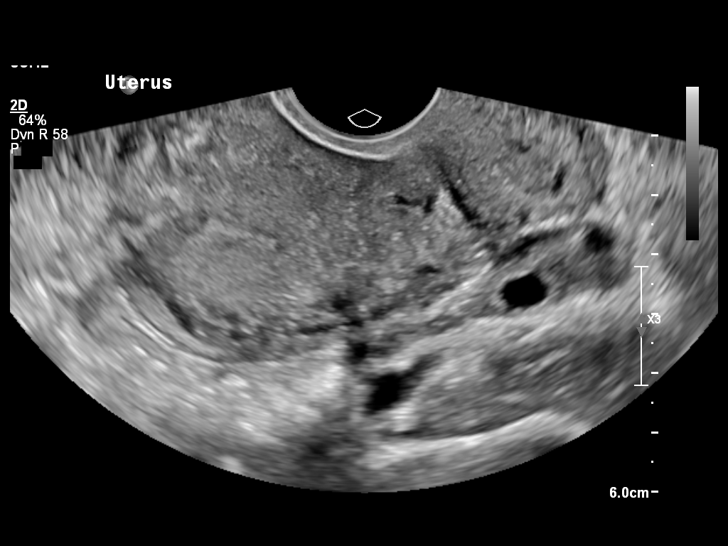
[im 10/37]
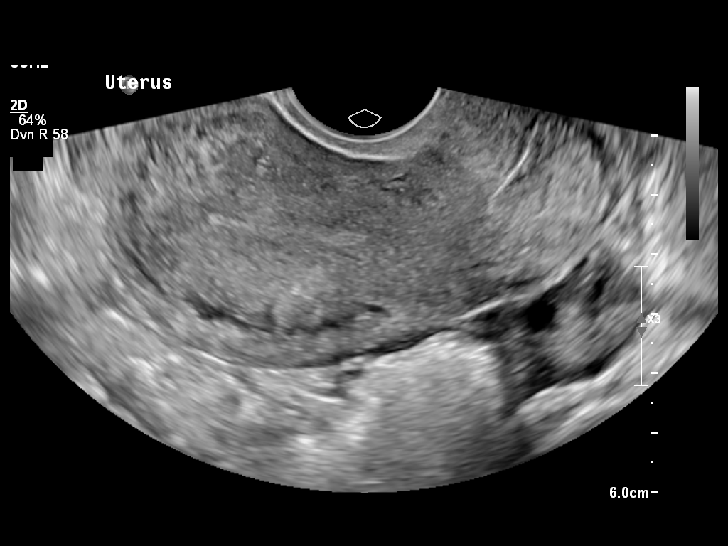
[im 13/37]
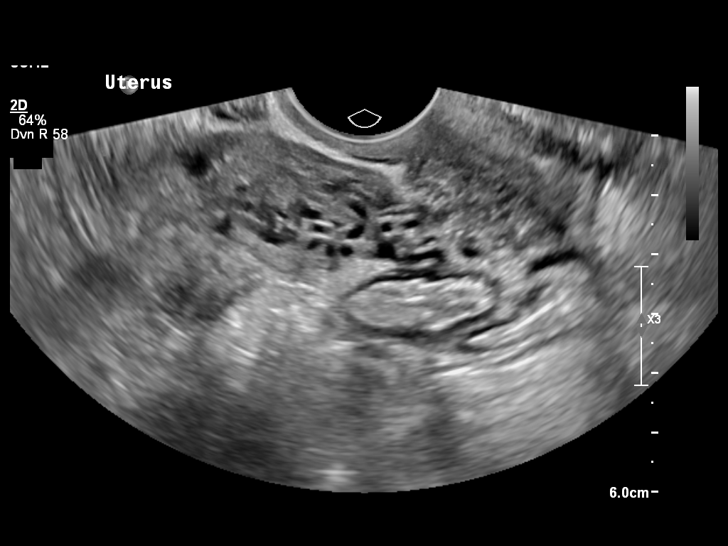
[im 14/37]
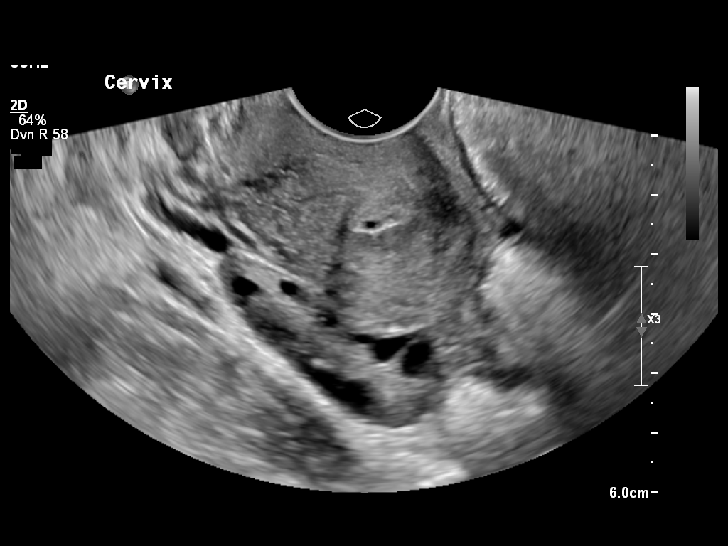
[im 17/37]
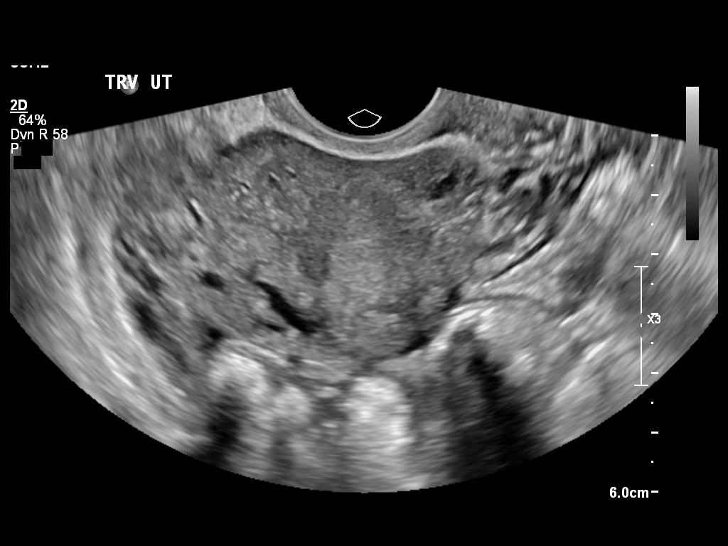
[im 20/37]
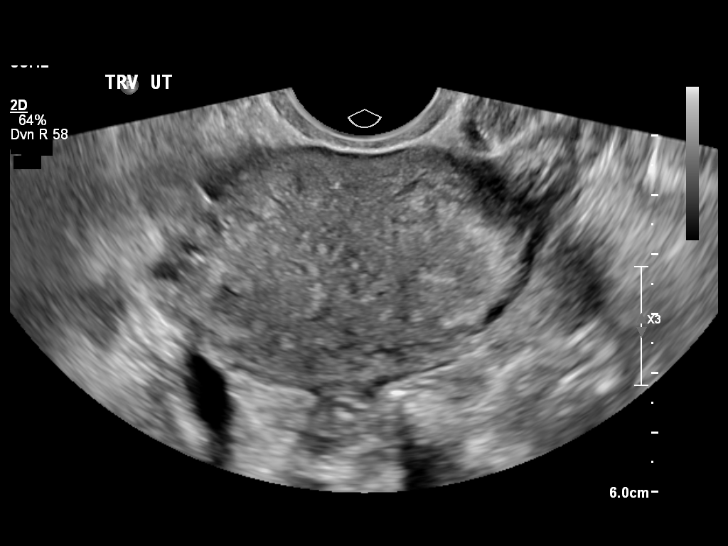
[im 23/37]
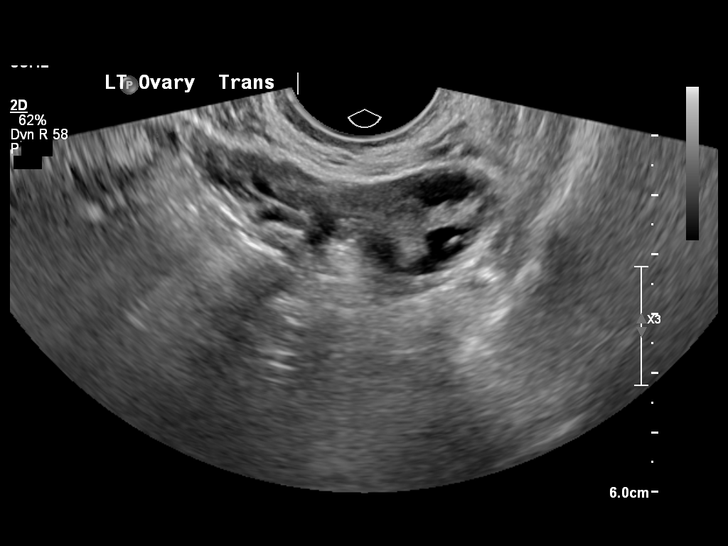
[im 25/37]
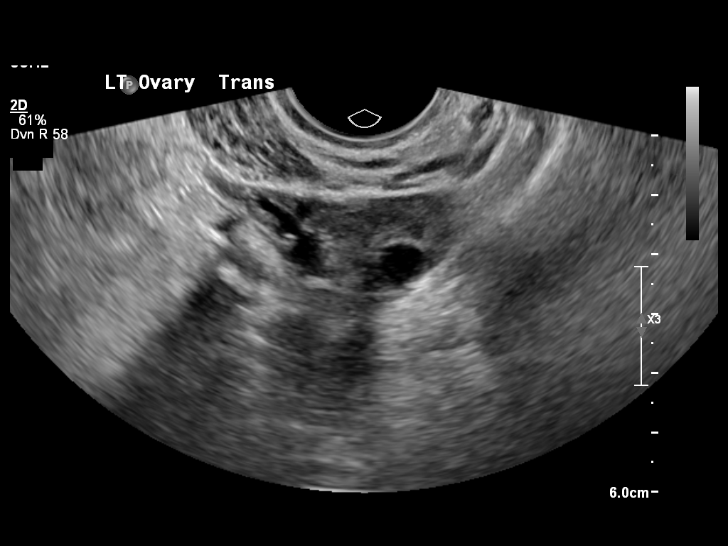
[im 28/37]
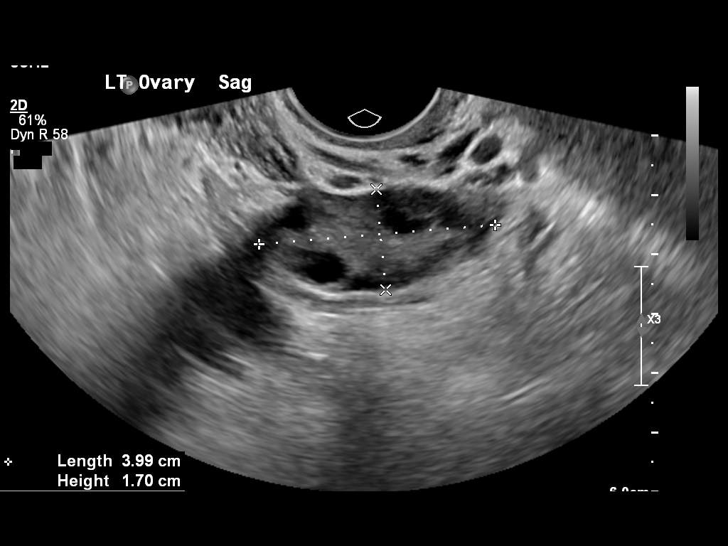
[im 31/37]
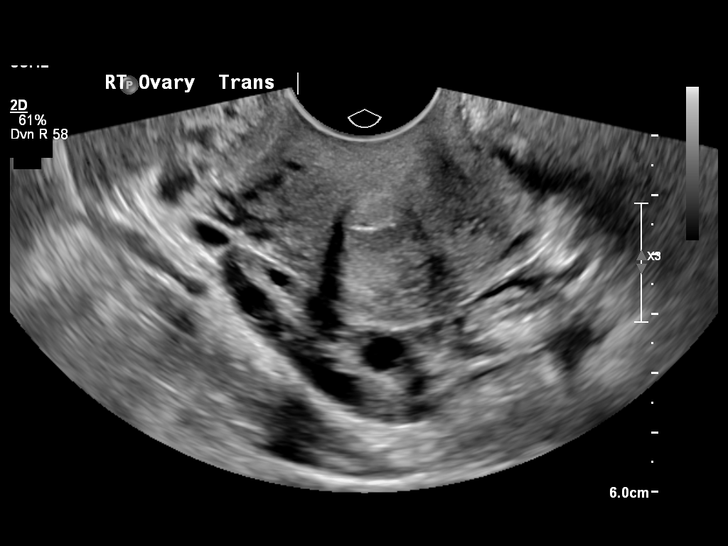
[im 34/37]
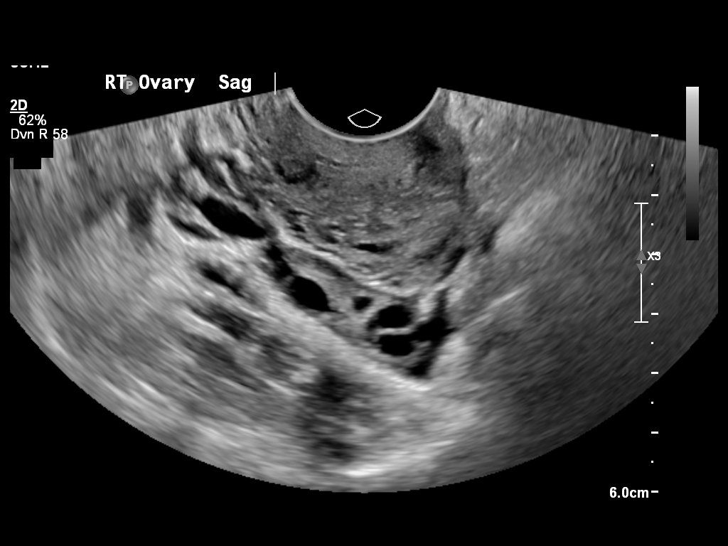
[im 37/37]
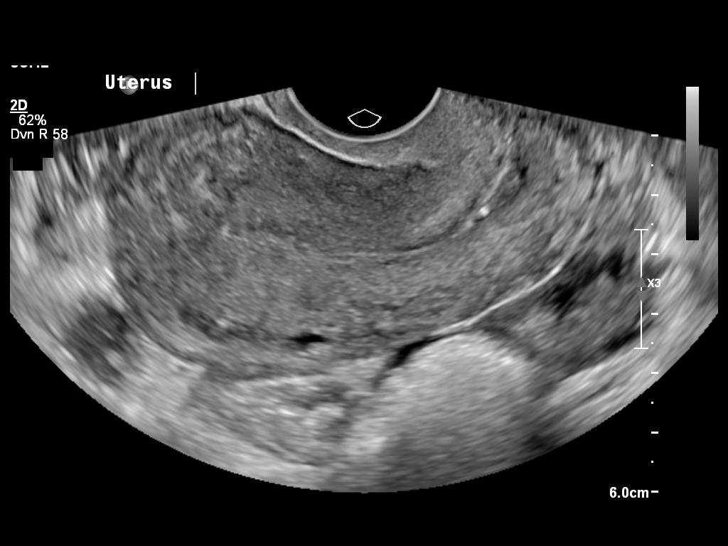

[14 of 25 positions shown; findings below may reference images not displayed]

## 2021-12-12 IMAGING — MR MRI SHOULDER ARTHROGRAM RIGHT
15 series · 40 of 40 positions shown · non-contrast
Comparison: none

[Series 2: survey · coronal · right · 1.6mm · 1.56mm/px · 12 of 119 slices shown]
[im 1/119]
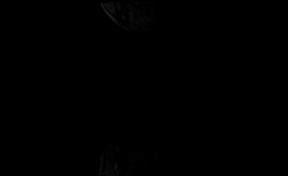
[im 11/119]
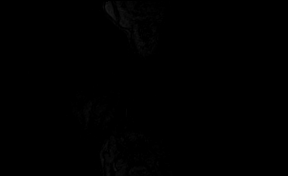
[im 22/119]
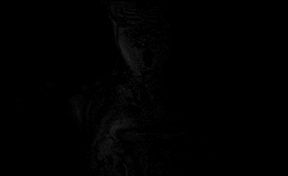
[im 33/119]
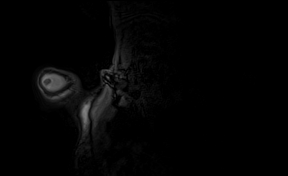
[im 43/119]
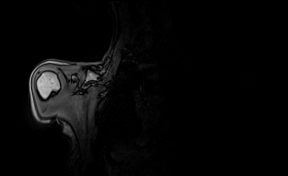
[im 54/119]
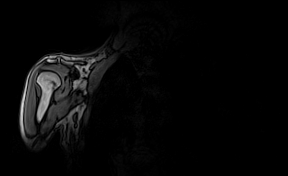
[im 65/119]
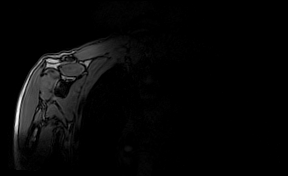
[im 76/119]
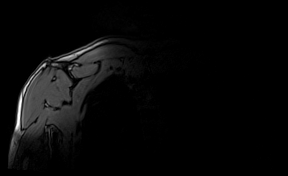
[im 86/119]
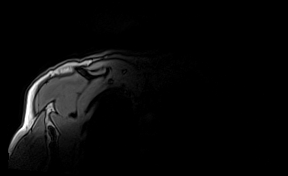
[im 97/119]
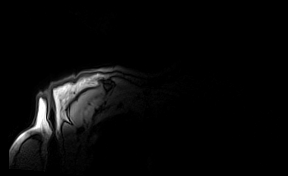
[im 108/119]
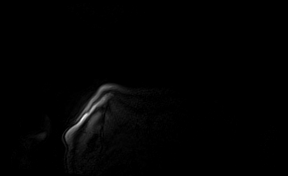
[im 119/119]
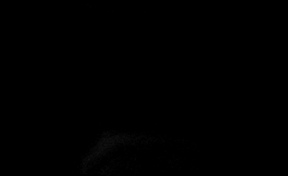

[Series 3: survey_mpr_sag · oblique · right · 1.6mm · 1.56mm/px · 2 of 9 slices shown (1 of 2)]
[im 1/9]
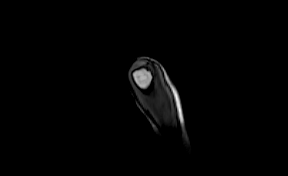
[im 9/9]
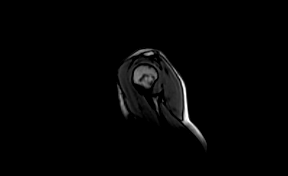

[Series 4: survey_mpr_cor · oblique · right · 1.6mm · 1.56mm/px · 1 of 11 slices shown (1 of 2)]
[im 1/11]
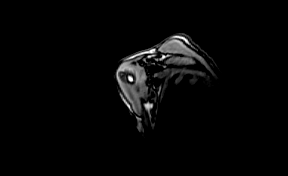

[Series 5: survey_mpr_(person_name) · axial · right · 1.6mm · 1.56mm/px · 1 of 15 slices shown (1 of 2)]
[im 1/15]
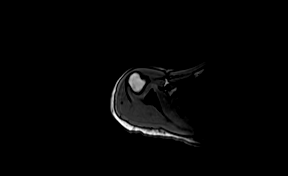

[Series 6: PD fat-sat · axial · right · 3.0mm · 0.47mm/px · z∈[-42,+59]mm · 3 of 32 slices shown]
[im 1/32]
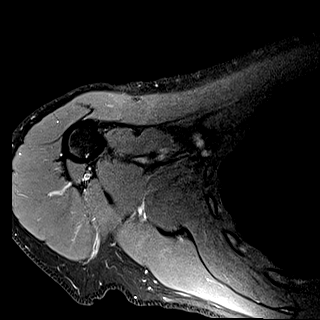
[im 16/32]
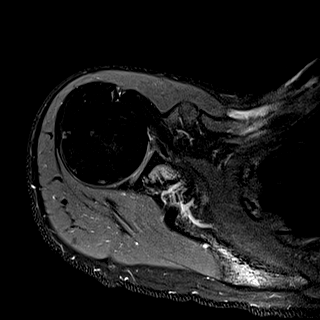
[im 32/32]
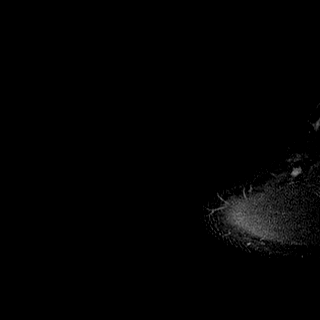

[Series 7: PD · oblique · right · 3.0mm · 0.47mm/px · 2 of 25 slices shown]
[im 1/25]
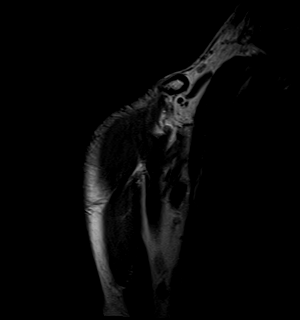
[im 25/25]
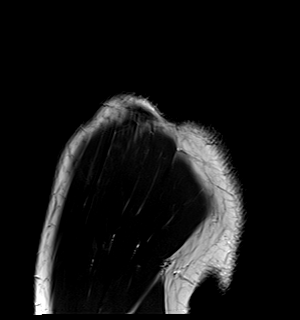

[Series 8: T2 fat-sat · oblique · right · 3.0mm · 0.47mm/px · 2 of 25 slices shown (1 of 2)]
[im 1/25]
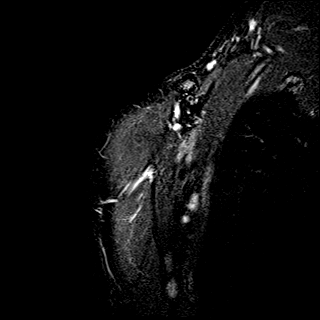
[im 25/25]
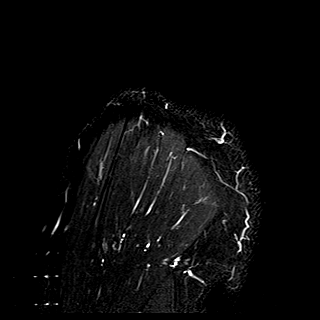

[Series 9: T2 fat-sat · sagittal · right · 3.0mm · 0.47mm/px · 3 of 30 slices shown (2 of 2)]
[im 1/30]
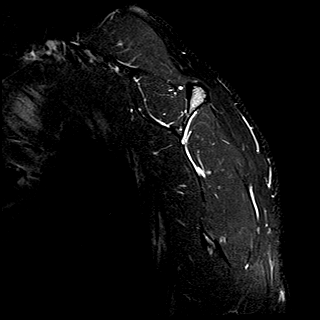
[im 15/30]
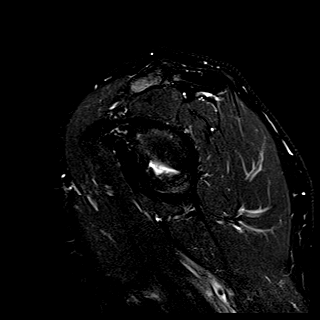
[im 30/30]
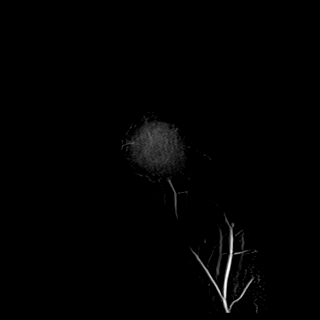

[Series 10: T1 · sagittal · right · 3.0mm · 0.39mm/px · 3 of 30 slices shown]
[im 1/30]
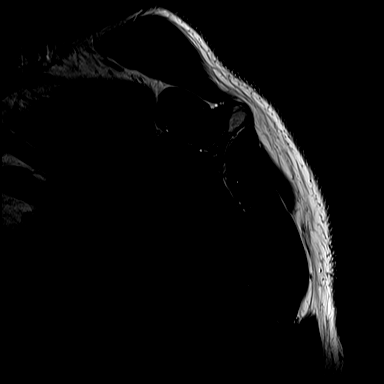
[im 15/30]
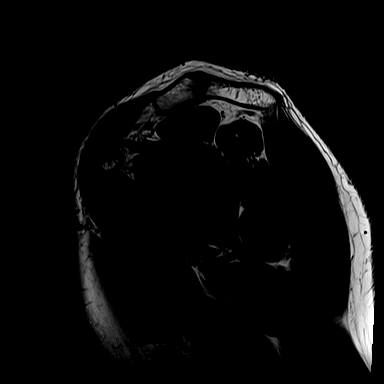
[im 30/30]
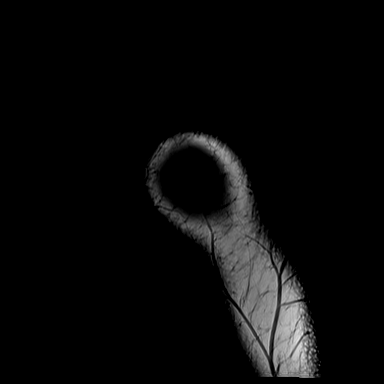

[Series 12: survey_mpr_sag · oblique · right · 1.6mm · 1.56mm/px · 1 of 9 slices shown (2 of 2)]
[im 1/9]
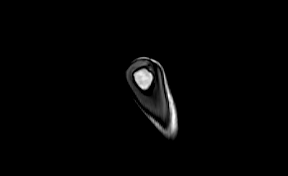

[Series 13: survey_mpr_cor · oblique · right · 1.6mm · 1.56mm/px · 1 of 11 slices shown (2 of 2)]
[im 1/11]
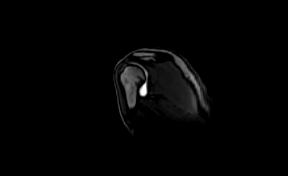

[Series 14: survey_mpr_(person_name) · axial · right · 1.6mm · 1.56mm/px · 1 of 15 slices shown (2 of 2)]
[im 1/15]
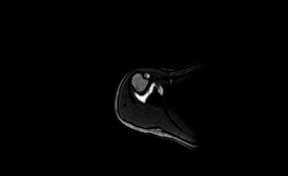

[Series 15: T1 fat-sat post-contrast · axial · right · 3.0mm · 0.47mm/px · z∈[-45,+55]mm · 3 of 32 slices shown (1 of 3)]
[im 1/32]
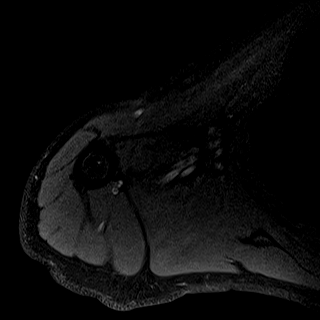
[im 16/32]
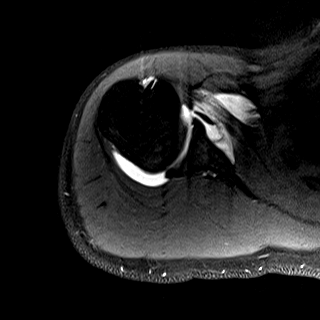
[im 32/32]
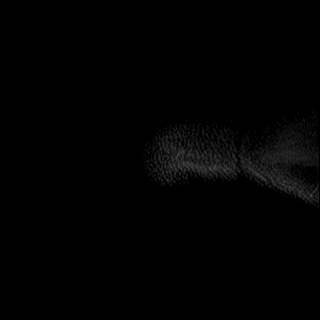

[Series 16: T1 fat-sat post-contrast · oblique · right · 3.0mm · 0.47mm/px · 2 of 25 slices shown (2 of 3)]
[im 1/25]
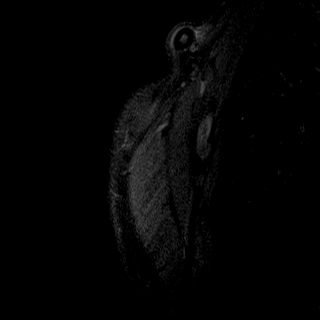
[im 25/25]
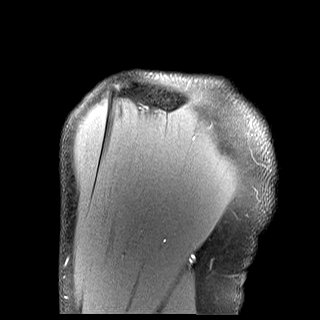

[Series 17: T1 fat-sat post-contrast · oblique · right · 3.0mm · 0.47mm/px · 3 of 27 slices shown (3 of 3)]
[im 1/27]
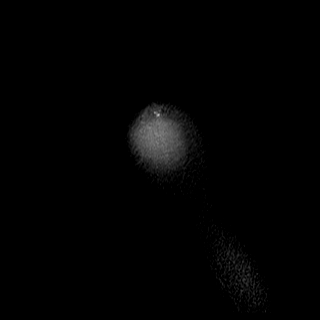
[im 14/27]
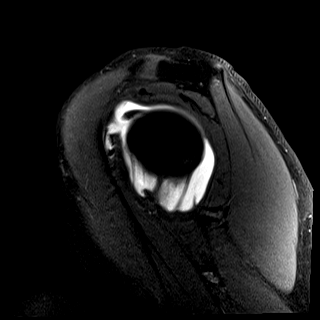
[im 27/27]
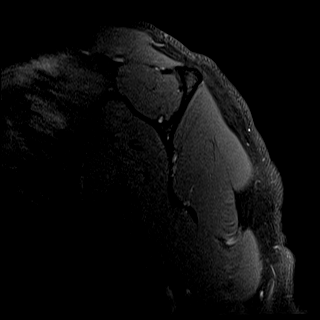

[40 of 40 positions shown; findings below may reference images not displayed]

ADDENDUM:
MRI arthrogram of the right shoulder was performed.
.
Is the patient pregnant?
No

## 2021-12-12 IMAGING — MR MRI THORACIC SPINE WITH AND  WITHOUT CONTRAST
9 of 12 series · 23 of 48 positions shown · non-contrast
Comparison: None.

FINAL REPORT:
EXAM: Thoracic spine MRI with and without contrast.
HISTORY: .Myalgia, other site
TECHNIQUE: Multisequence multiplanar images were obtained of the thoracic spine before and after administration of IV contrast ( ).

[Series 13: T2 · sagittal · 4.0mm · 0.76mm/px · 3 of 15 slices shown]
[im 1/15]
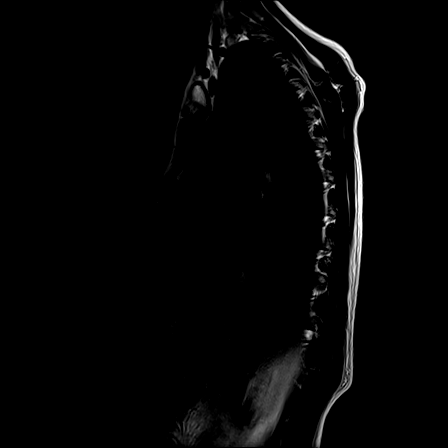
[im 8/15]
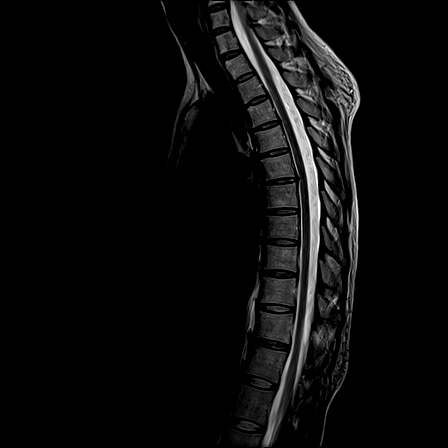
[im 15/15]
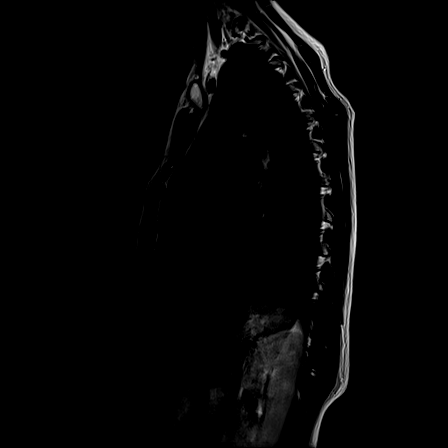

[Series 14: T1 · sagittal · 4.0mm · 1.06mm/px · 2 of 15 slices shown (1 of 3)]
[im 1/15]
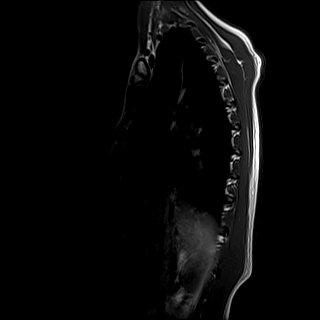
[im 15/15]
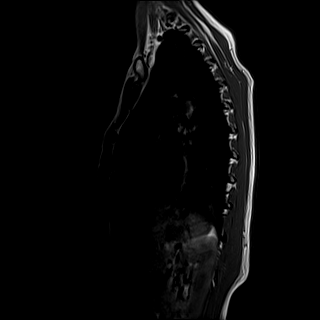

[Series 15: STIR · sagittal · 4.0mm · 0.89mm/px · 2 of 15 slices shown]
[im 1/15]
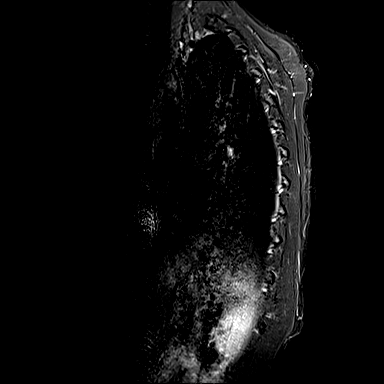
[im 15/15]
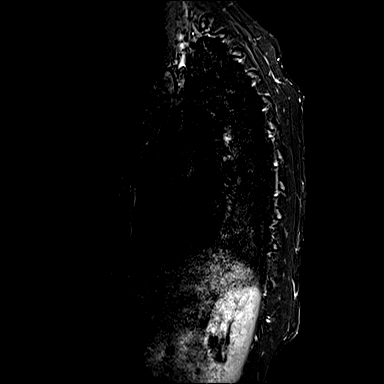

[Series 16: GRE · axial · 7.0mm · 0.39mm/px · z∈[-199,-112]mm · 2 of 25 slices shown]
[im 1/25]
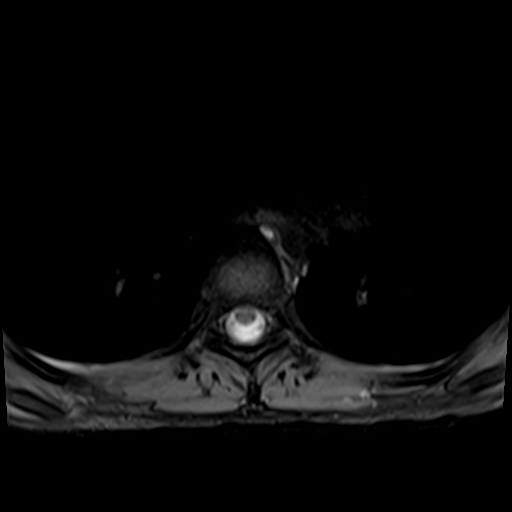
[im 13/25]
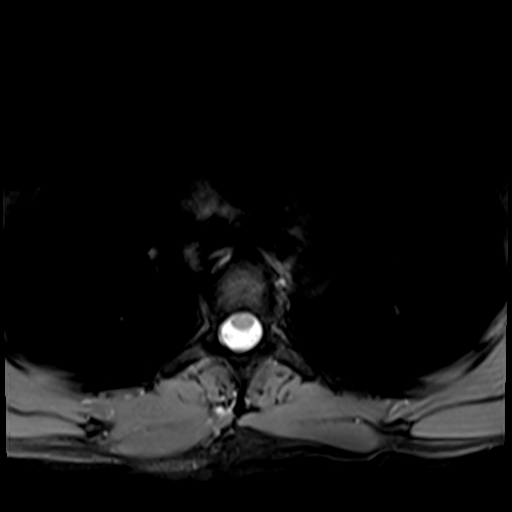

[Series 18: T1 · axial · non-contrast · 7.0mm · 0.39mm/px · z∈[-199,-24]mm · 3 of 25 slices shown (2 of 3)]
[im 1/25]
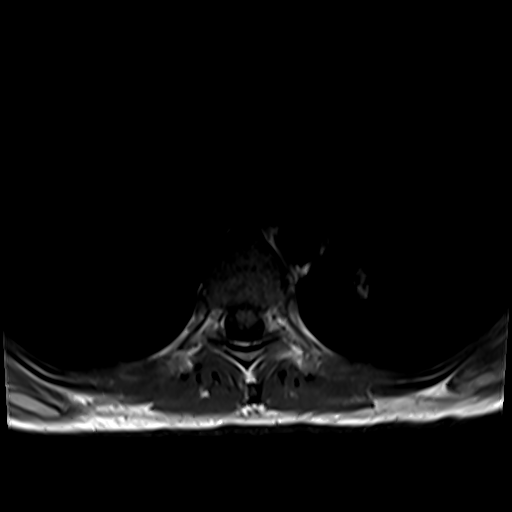
[im 13/25]
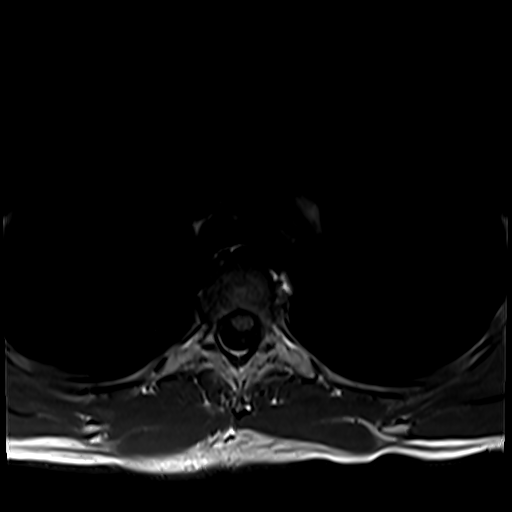
[im 25/25]
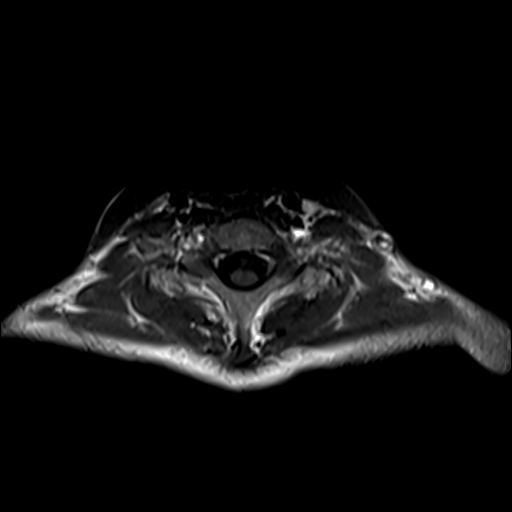

[Series 19: T1 · axial · non-contrast · 7.0mm · 0.39mm/px · z∈[-330,-154]mm · 3 of 25 slices shown (3 of 3)]
[im 1/25]
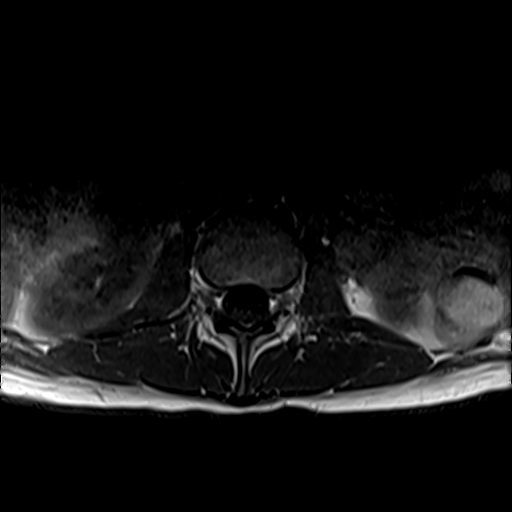
[im 13/25]
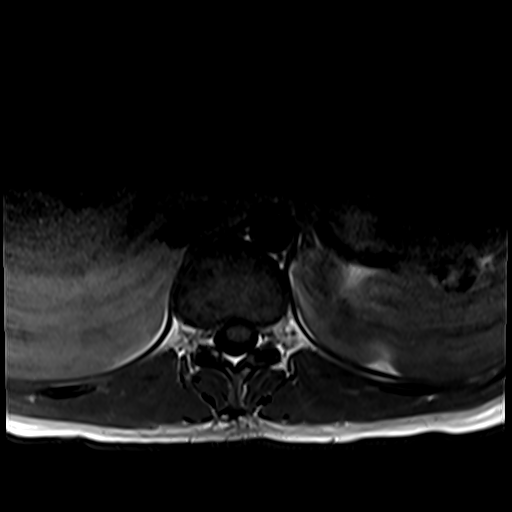
[im 25/25]
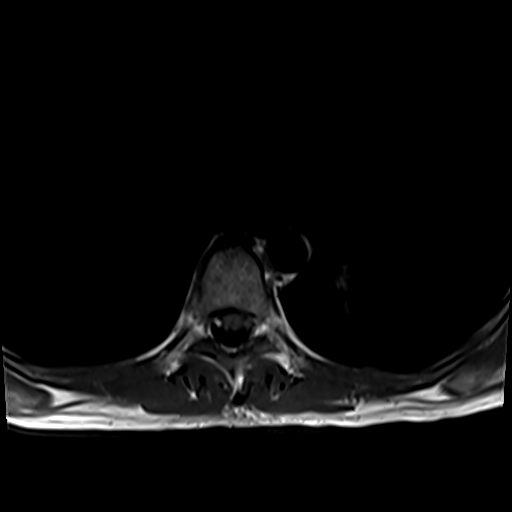

[Series 23: T1 fat-sat post-contrast · sagittal · 4.0mm · 1.06mm/px · 2 of 15 slices shown (1 of 3)]
[im 1/15]
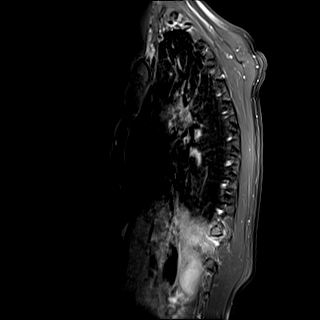
[im 15/15]
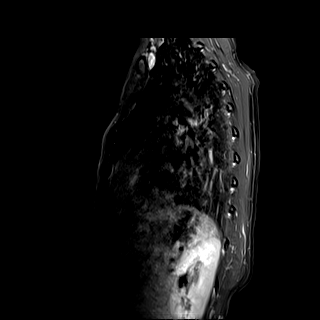

[Series 25: T1 fat-sat post-contrast · axial · 7.0mm · 0.39mm/px · z∈[-330,-154]mm · 3 of 25 slices shown (2 of 3)]
[im 1/25]
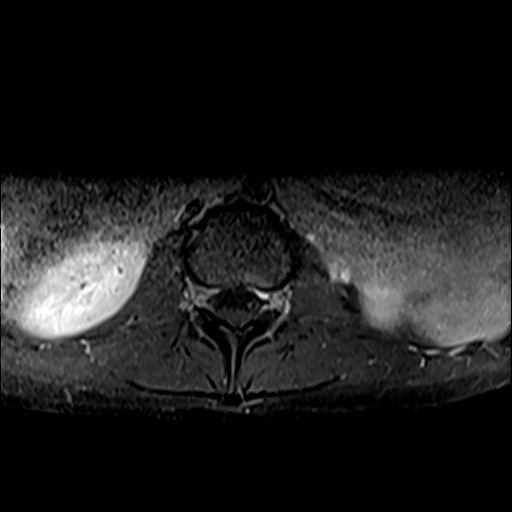
[im 13/25]
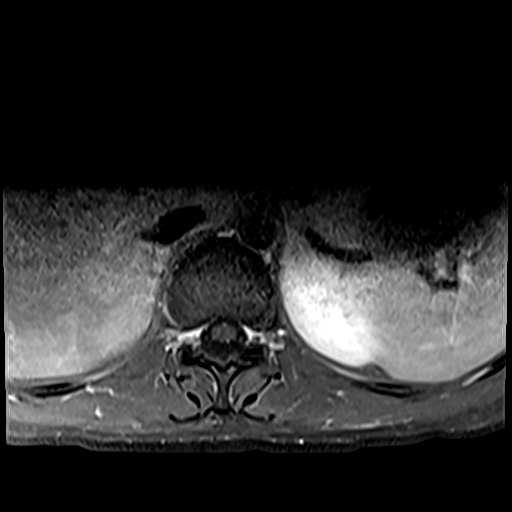
[im 25/25]
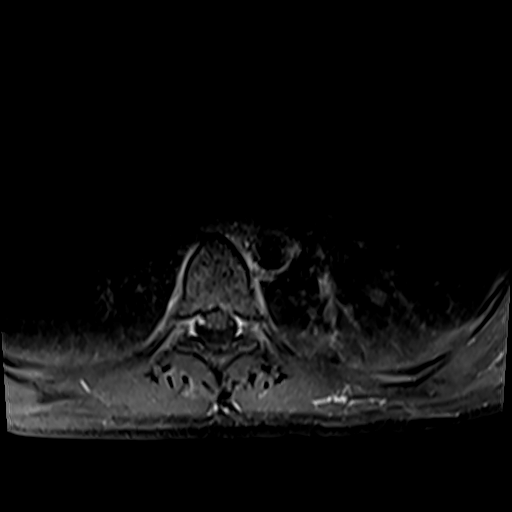

[Series 26: T1 fat-sat post-contrast · axial · 7.0mm · 0.39mm/px · z∈[-199,-24]mm · 3 of 25 slices shown (3 of 3)]
[im 1/25]
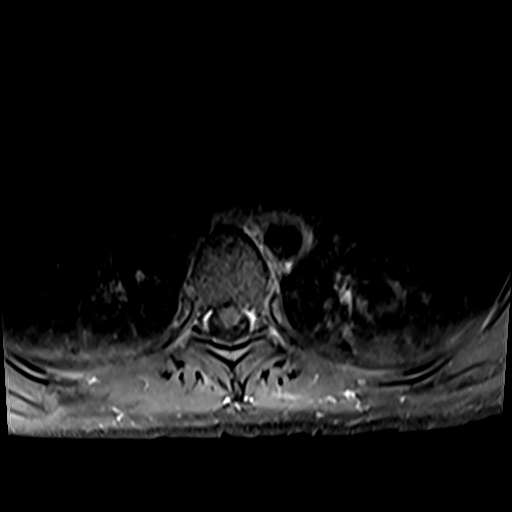
[im 13/25]
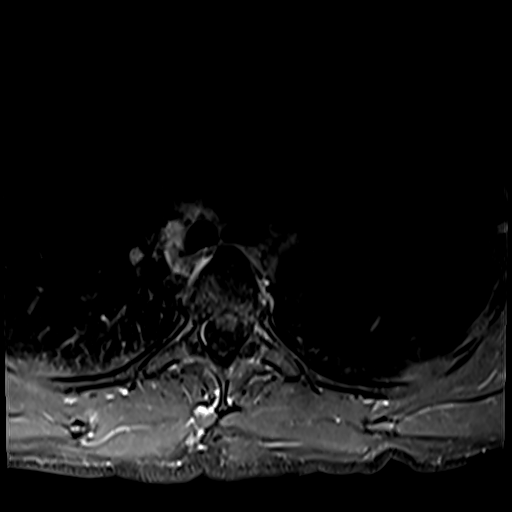
[im 25/25]
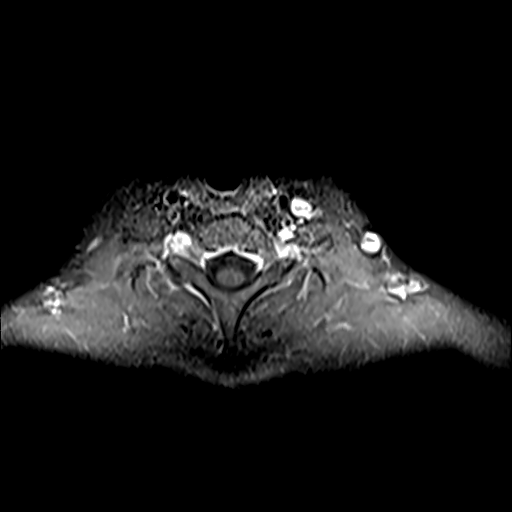

[23 of 48 positions shown; findings below may reference images not displayed]

FINDINGS: Spinal alignment is within normal limits.  No fractures are seen. No large pleural effusions. No thoracic aortic aneurysm. No abnormal enhancing lesions within the vertebral bodies, paraspinal soft tissues, or spinal canal. No abnormal enhancing lesions within the liver, kidneys, spleen, adrenal glands. Subsegmental dependent atelectasis.
Vertebral bodies demonstrate normal height and marrow signal.
The spinal cord shows no intrinsic signal abnormality. Paraspinal soft tissues appear unremarkable.
No significant spinal canal or neural foraminal stenosis at any level. Facet joints are unremarkable.
IMPRESSION: No high-grade neural foraminal or spinal canal stenosis. No abnormal cord signal or enhancement.
Is the patient pregnant?
No

## 2021-12-12 IMAGING — RF FL SHOULDER ARTHROGRAM RIGHT
1 series · 1 of 1 positions shown · IV contrast (isovue)
Comparison: none

FINAL REPORT:
RIGHT SHOULDER FLUOROSCOPIC GUIDED CONTRAST INJECTION.
Brief operative note:
Operator: Mankwan Ayson, NP
Assistant: None
Pre and postop diagnosis: Impingement syndrome of left shoulder
Procedure: FL SHOULDER ARTHROGRAM RIGHT
Anesthesia: Local using 1% lidocaine
Estimated blood loss: None
Blood administered: None
Complications: None
Implants: None
INDICATION: Impingement syndrome of left shoulder
TECHNIQUE: The risks and benefits of fluoroscopic guided intra-articular contrast injection was discussed with the patient and informed consent was obtained.
Fluoro time -Air Kerma: 0.70  mgy
The procedure performed by Lalito Bieber, NP
The right shoulder was localized with fluoroscopic guidance.  The planned procedure site was subsequently prepped and draped in the usual sterile fashion.  Local anesthesia was obtained with injection of 2 cc of 1% lidocaine.  A 22 gauge needle was advanced to the joint space under fluoroscopic guidance.  Subsequently a small amount of contrast was injected to confirm intra-articular location.  10 cc of a mixture of 10 cc of Isovue 300, 10 cc normal saline, and 0.1 cc ProHance gadolinium contrast was subsequently injected.  The patient tolerated the procedure well with no immediate complication.

[Series 1: fluoro_arthrogram_(person_name)_(person_ · 1 of 1 slices shown]
[im 1/1]
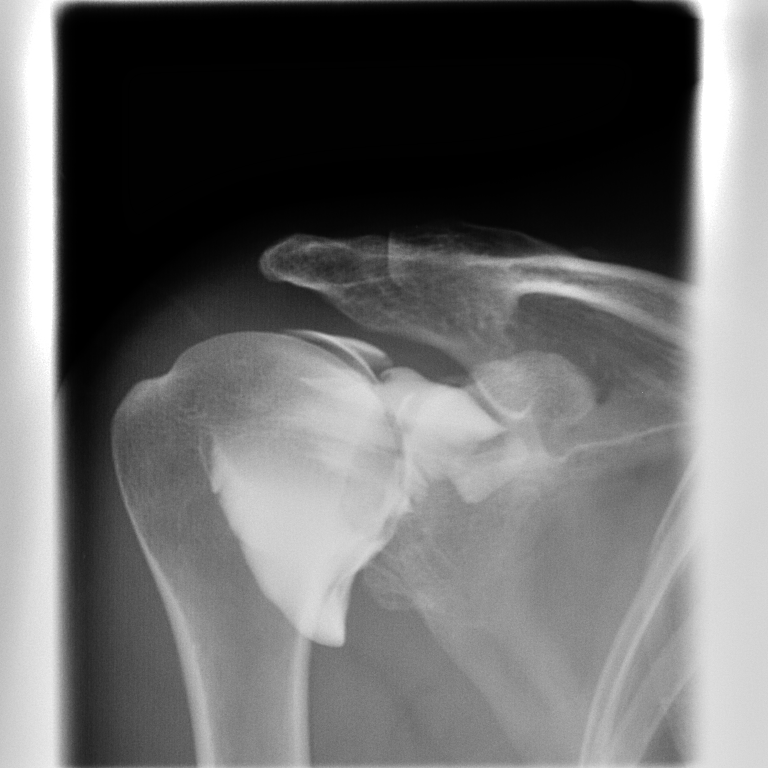

[1 of 1 positions shown; findings below may reference images not displayed]

IMPRESSION: 
IMPRESSION: 1.  Technically successful right shoulder fluoroscopic guided intra-articular contrast injection for MRI arthrogram.
-MLP-
Is the patient pregnant?
No

## 2021-12-12 IMAGING — MR MRI CERVICAL SPINE WITH AND WITHOUT CONTRAST
11 of 13 series · 45 of 48 positions shown · IV contrast (PROHANCE)
Comparison: None

FINAL REPORT:
EXAM: MR Cervical Spine with and without Contrast,
HISTORY: Myalgia, other site
TECHNIQUE: Multisequence multiplanar images of the cervical spine were obtained. IV Contrast administered

[Series 1: survey · coronal · 1.7mm · 1.67mm/px · 13 of 96 slices shown]
[im 1/96]
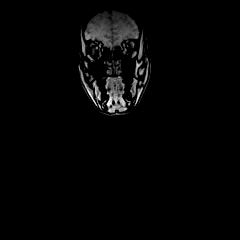
[im 8/96]
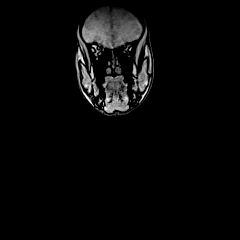
[im 16/96]
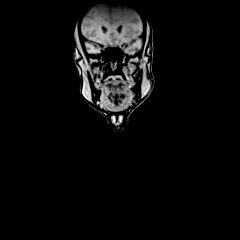
[im 24/96]
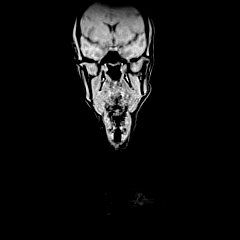
[im 32/96]
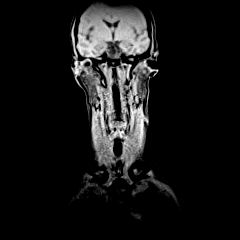
[im 40/96]
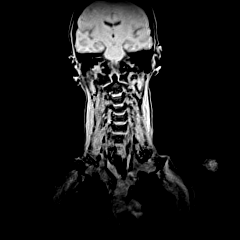
[im 48/96]
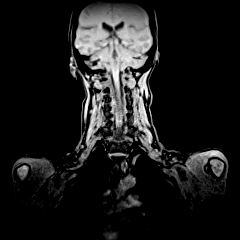
[im 56/96]
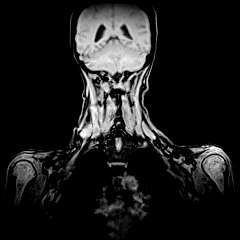
[im 64/96]
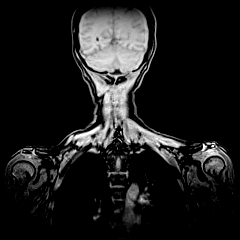
[im 72/96]
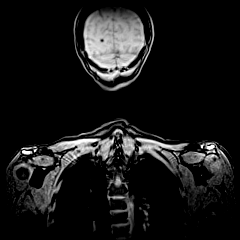
[im 80/96]
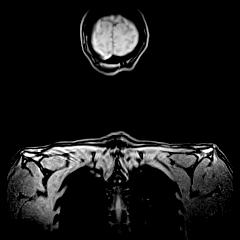
[im 88/96]
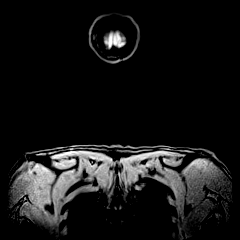
[im 96/96]
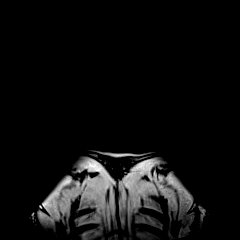

[Series 3: survey_mpr_sag · sagittal · 1.7mm · 1.67mm/px · 2 of 15 slices shown]
[im 1/15]
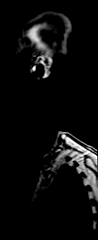
[im 15/15]
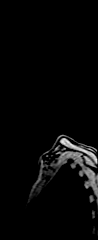

[Series 4: survey_mpr_(person_name) · axial · 1.7mm · 1.67mm/px · 1 of 7 slices shown]
[im 1/7]
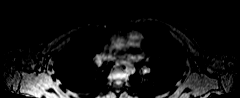

[Series 5: T2 · sagittal · 3.0mm · 0.57mm/px · 2 of 15 slices shown (1 of 2)]
[im 1/15]
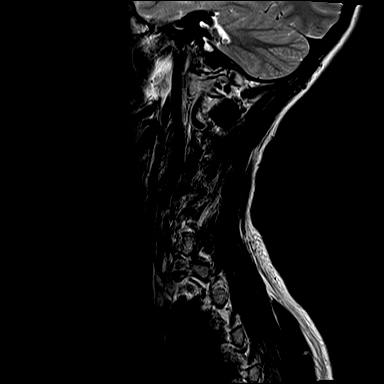
[im 15/15]
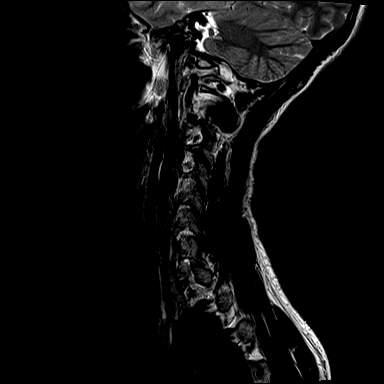

[Series 6: T1 · sagittal · 3.0mm · 0.69mm/px · 2 of 15 slices shown (1 of 2)]
[im 1/15]
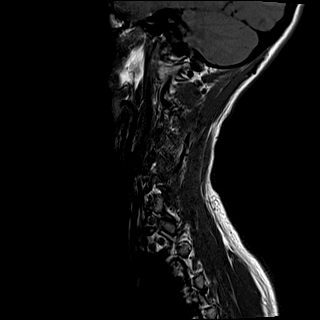
[im 15/15]
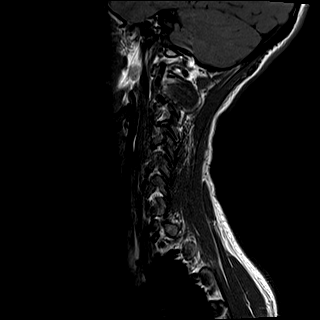

[Series 7: STIR · sagittal · 3.0mm · 0.86mm/px · 2 of 15 slices shown]
[im 1/15]
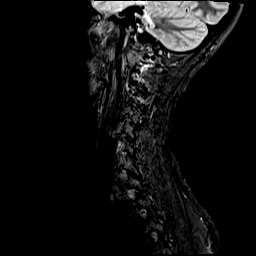
[im 15/15]
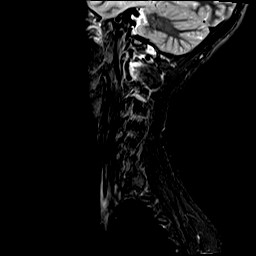

[Series 8: GRE · axial · 3.0mm · 0.70mm/px · z∈[-63,+30]mm · 4 of 30 slices shown]
[im 1/30]
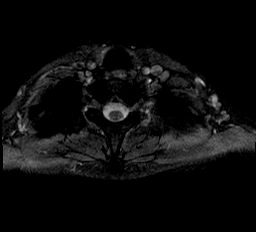
[im 10/30]
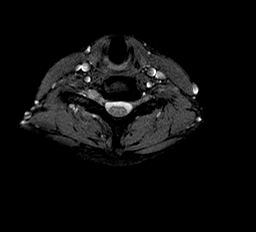
[im 20/30]
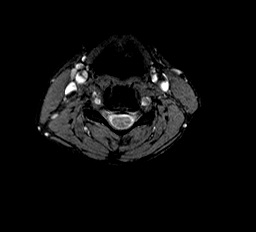
[im 30/30]
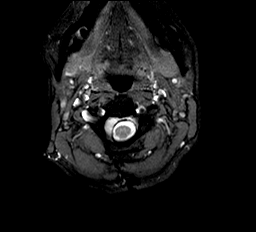

[Series 9: T2 · axial · 2.0mm · 0.70mm/px · z∈[-60,+32]mm · 7 of 48 slices shown (2 of 2)]
[im 1/48]
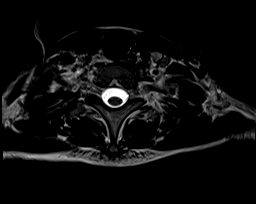
[im 8/48]
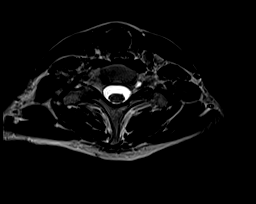
[im 16/48]
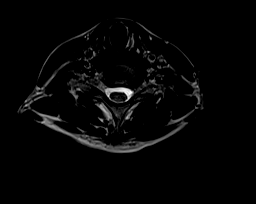
[im 24/48]
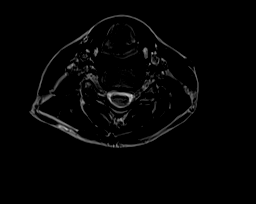
[im 32/48]
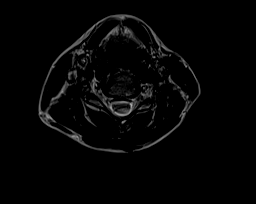
[im 40/48]
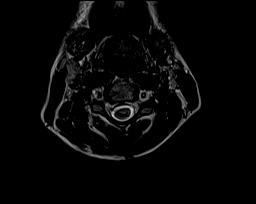
[im 48/48]
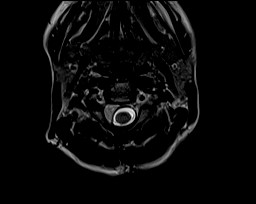

[Series 13: T1 · axial · non-contrast · 3.0mm · 0.47mm/px · z∈[-52,+64]mm · 5 of 37 slices shown (2 of 2)]
[im 1/37]
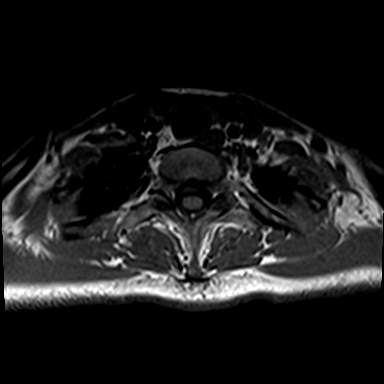
[im 10/37]
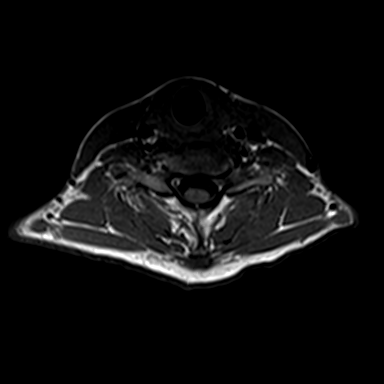
[im 19/37]
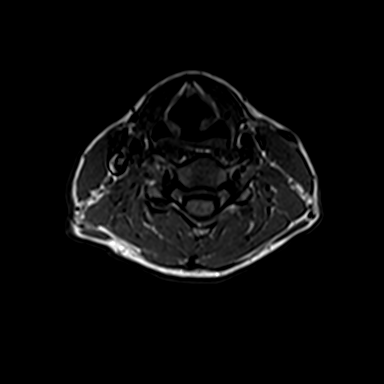
[im 28/37]
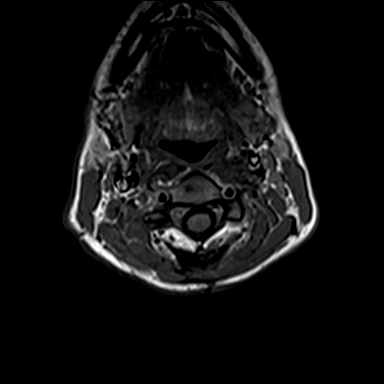
[im 37/37]
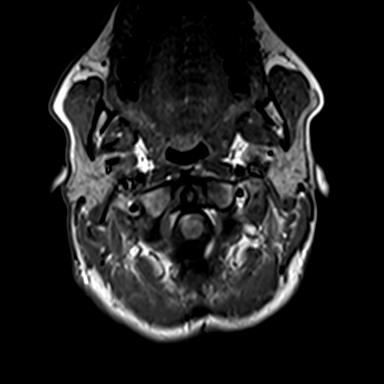

[Series 14: T1 fat-sat post-contrast · axial · 3.0mm · 0.47mm/px · z∈[-52,+64]mm · 5 of 37 slices shown (1 of 2)]
[im 1/37]
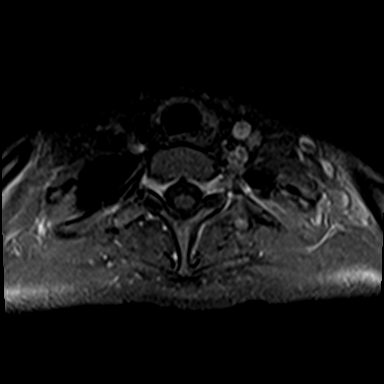
[im 10/37]
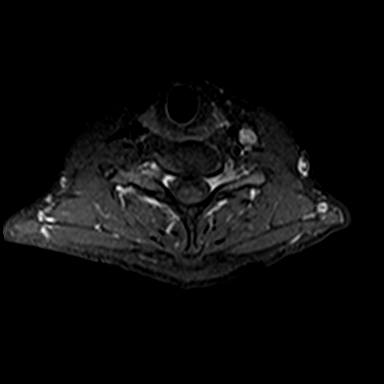
[im 19/37]
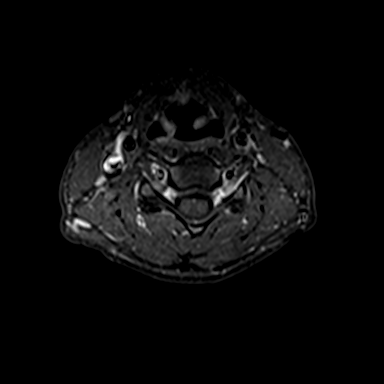
[im 28/37]
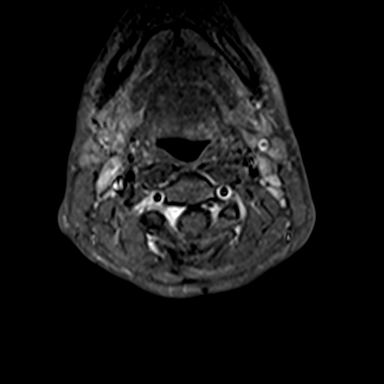
[im 37/37]
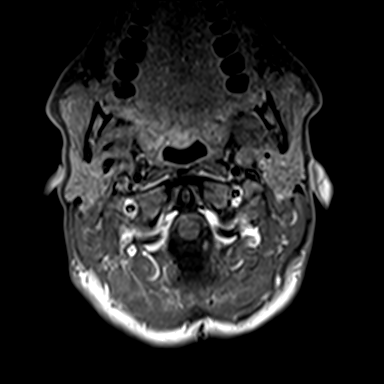

[Series 15: T1 fat-sat post-contrast · sagittal · 3.0mm · 0.57mm/px · 2 of 15 slices shown (2 of 2)]
[im 1/15]
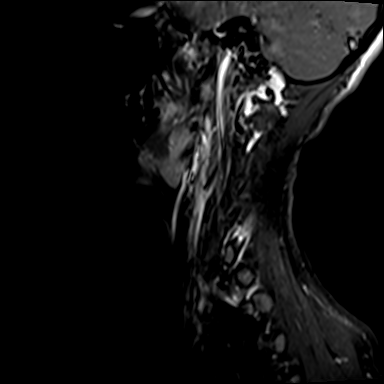
[im 15/15]
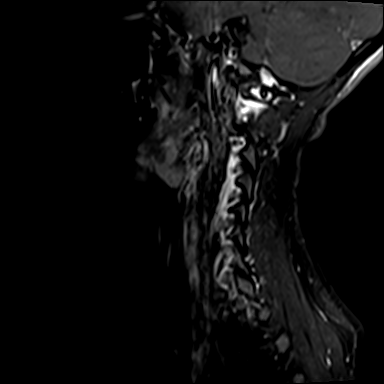

[45 of 48 positions shown; findings below may reference images not displayed]

FINDINGS: Sagittal images extend from C2 through T2. Axial images extend from C2 through T1.
Spinal alignment is normal. Vertebral bodies demonstrate normal height and marrow signal. The spinal cord shows no intrinsic signal abnormality. Visible posterior fossa structures are unremarkable, with no cerebellar tonsillar ectopia. Paraspinal soft tissues are unremarkable. No Chiari malformation. There is degenerative disc loss at C2-C3, C3-C4, C4-C5. Flow voids are present within the major vessels of the neck. Bilateral parotid glands and submandibular glands are normal. No cervical adenopathy. No retropharyngeal soft tissue swelling. After contrast administration there is no abnormal enhancement within the vertebral bodies, spinal canal, or paraspinal soft tissues.
Findings at individual disc levels are as follows:
C2-3: No neuroforaminal stenosis or spinal canal stenosis. Tiny disc osteophyte complex. No facet arthropathy.
C3-C4: Small broad-based disc osteophyte complex. There is minimal to mild spinal canal stenosis. No neural foraminal stenosis. Minimal facet arthropathy.
C4-C5: There is a broad-based disc osteophyte complex. Minimal bilateral neural foraminal stenosis. Minimal facet arthropathy. There is mild to moderate spinal canal stenosis.
C5-C6: Mild bilateral neural foraminal stenosis. This is due to uncovertebral joint hypertrophy and facet arthropathy. No disc bulge. No spinal canal stenosis.
C6-C7: Tiny disc osteophyte complex. No neural foraminal or spinal canal stenosis. Mild facet arthropathy.
C7-T1: No neural foraminal or spinal canal stenosis. No disc bulge. No facet arthropathy. Tiny left perineural cyst.
IMPRESSION: Mild-to-moderate degenerative changes in the upper and mid cervical spine. No high-grade neural foraminal or spinal canal stenoses. No abnormal enhancement within the vertebral bodies or central cord.
Is the patient pregnant?
No

## 2021-12-25 IMAGING — CT CT LUMBAR SPINE WITHOUT CONTRAST
4 of 5 series · 16 of 33 positions shown, 18 images · non-contrast
Comparison: None available

Diffuse myofascial pain syndrome /
FINAL REPORT:
INDICATION: Myalgia, other site
Exam: CT of the Lumbar spine without IV contrast
TECHNIQUE: Multiplanar unenhanced images were obtained through the lumbar spine.
All CT scans at this facility use iterative reconstruction technique, dose modulation and/or weight based dosing when appropriate to reduce radiation dose to as low as reasonably achievable.

[Series 2: l spine · axial · 0.29mm/px · z∈[-53,+82]mm · 4 of 92 slices shown, 5 images]
[im 19/92  soft-tissue]
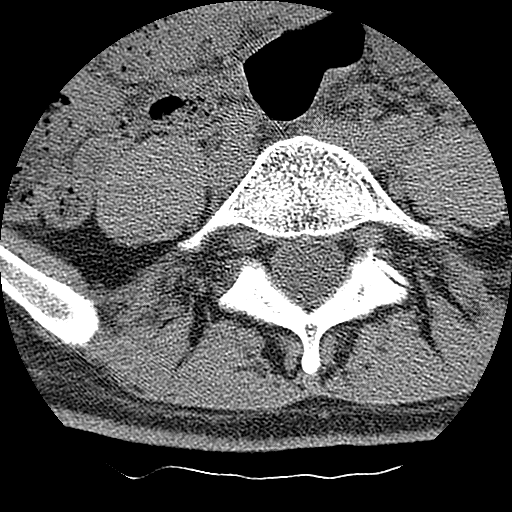
[im 19/92  bone]
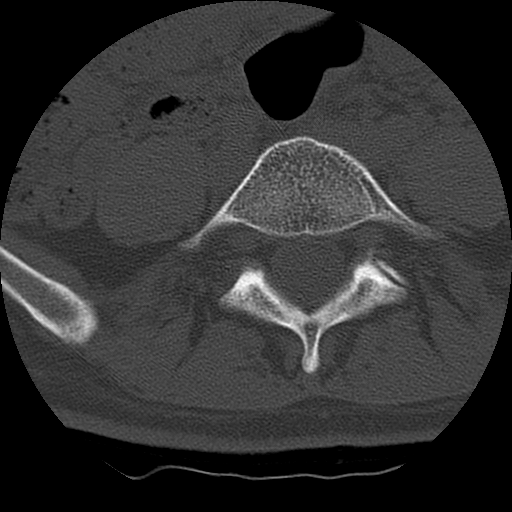
[im 37/92  bone]
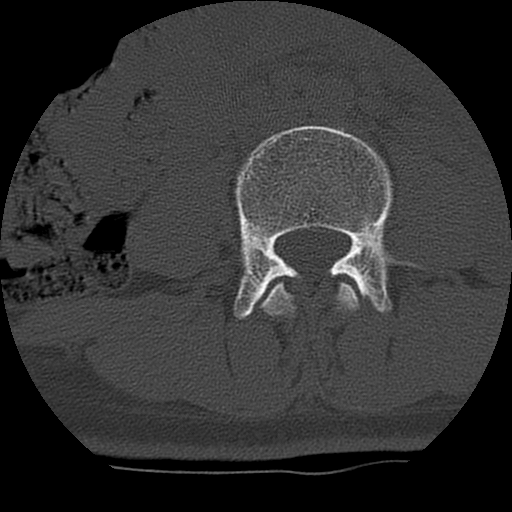
[im 55/92  bone]
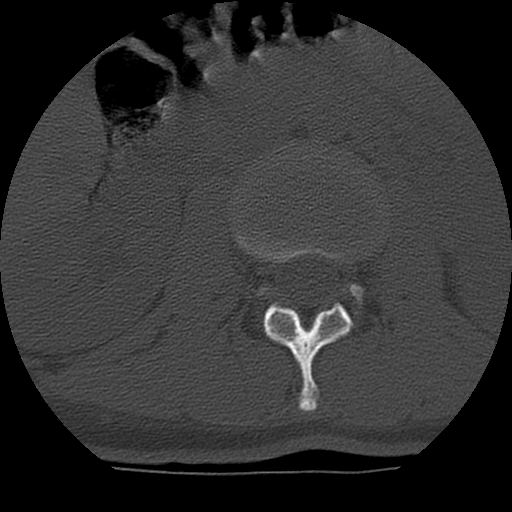
[im 73/92  bone]
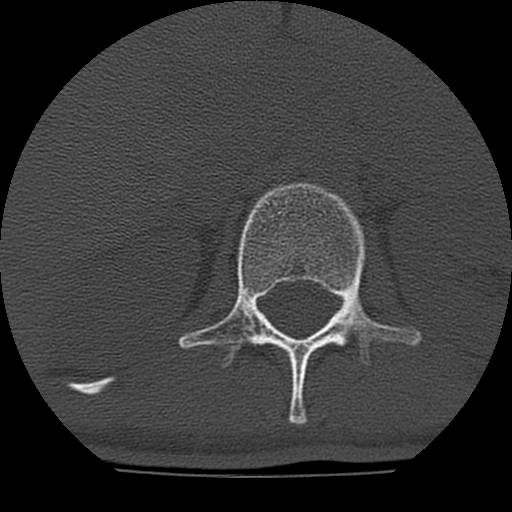

[Series 301: standard · axial · 0.29mm/px · z∈[-53,+82]mm · 4 of 91 slices shown]
[im 19/91  soft-tissue]
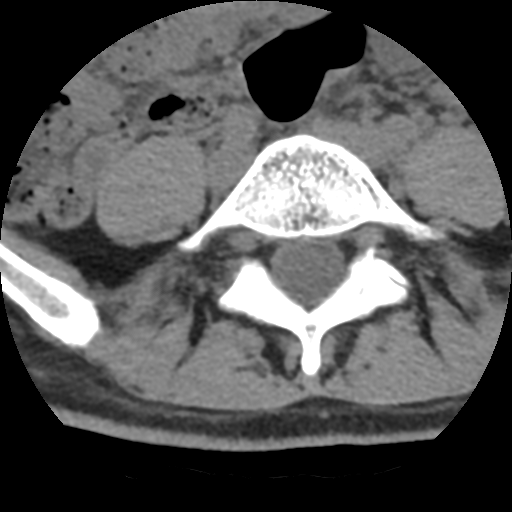
[im 37/91  soft-tissue]
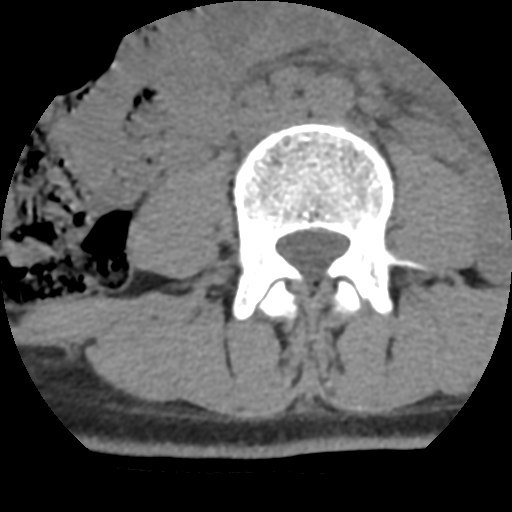
[im 55/91  soft-tissue]
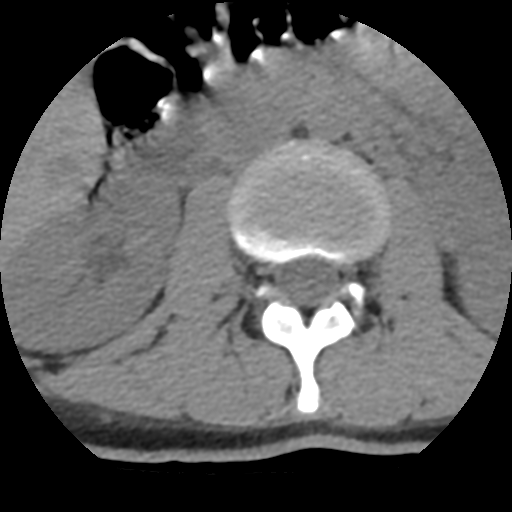
[im 73/91  soft-tissue]
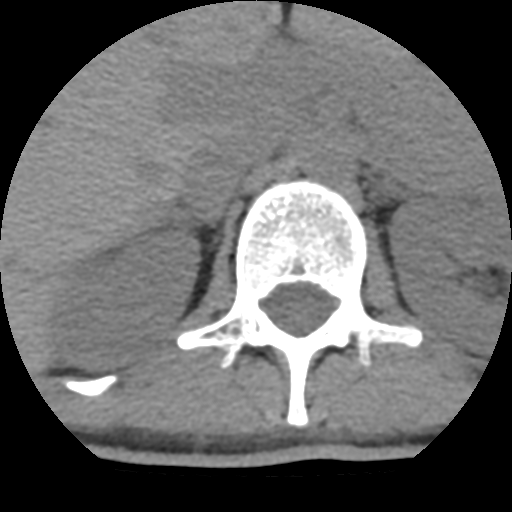

[Series 601: coronal · coronal · 0.45mm/px · 3 of 75 slices shown]
[im 15/75  bone]
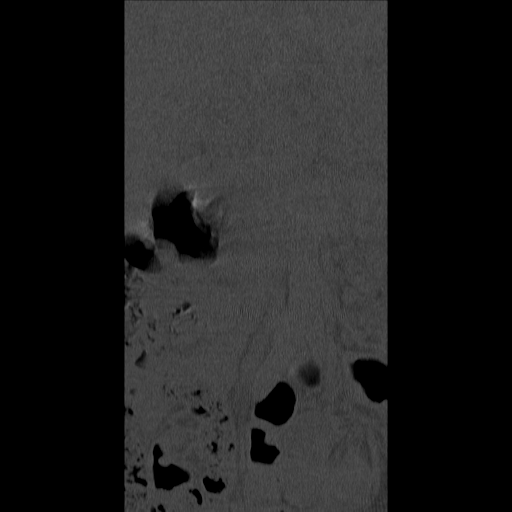
[im 30/75  bone]
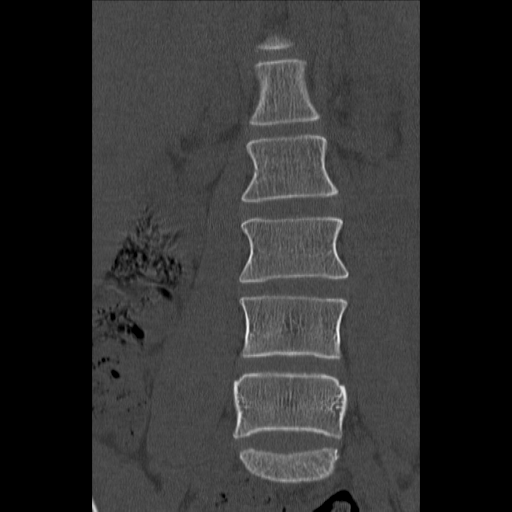
[im 45/75  bone]
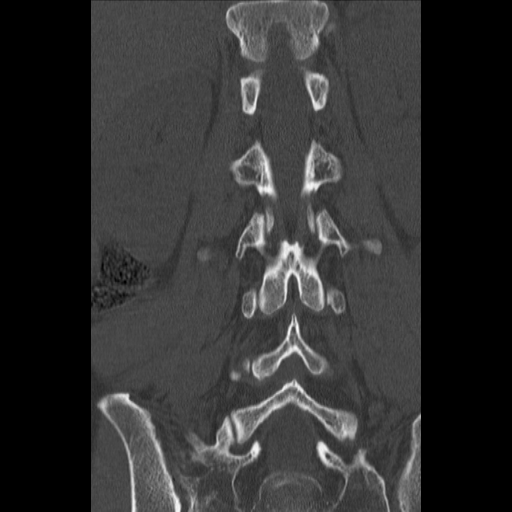

[Series 602: sagittal · sagittal · 0.45mm/px · 5 of 75 slices shown, 6 images]
[im 25/75  bone]
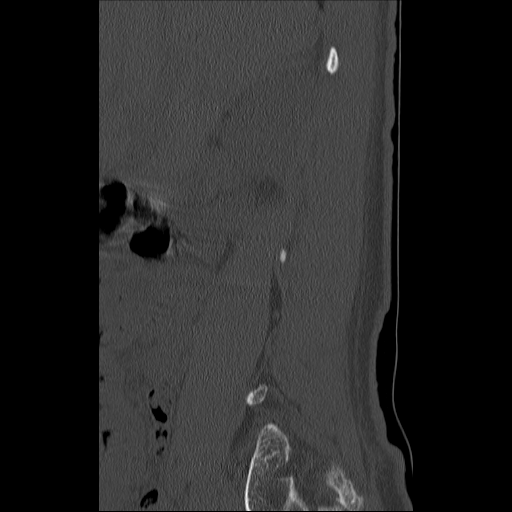
[im 31/75  bone]
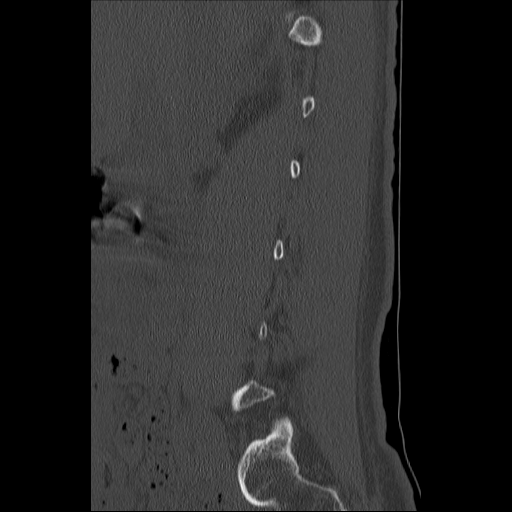
[im 38/75  soft-tissue]
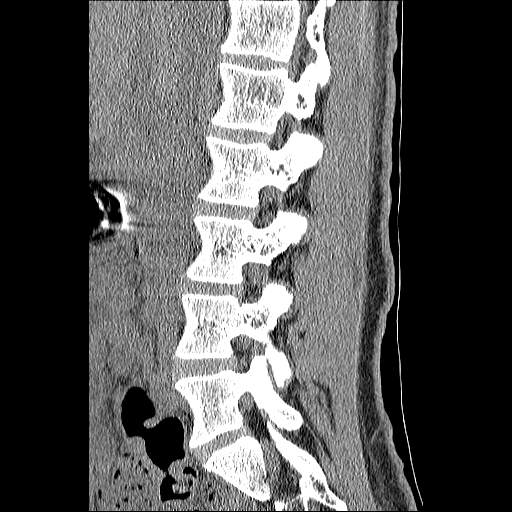
[im 38/75  bone]
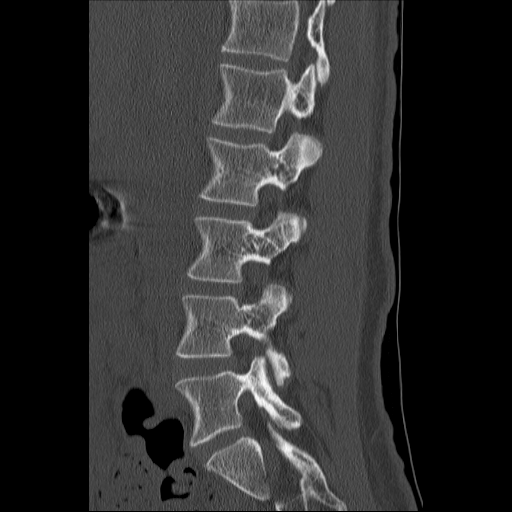
[im 44/75  bone]
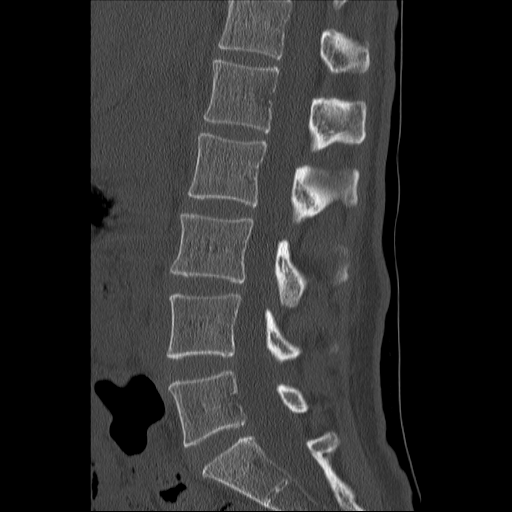
[im 50/75  bone]
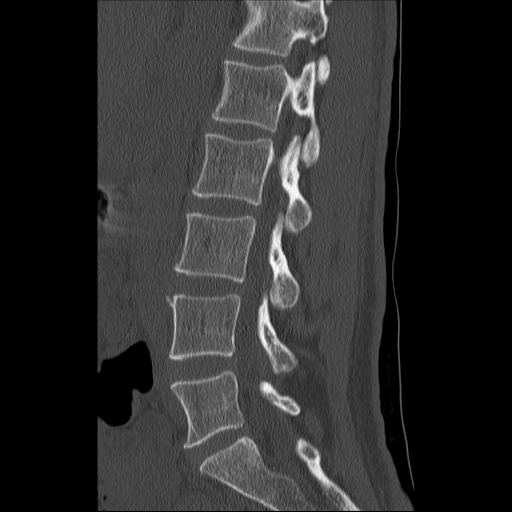

[16 of 33 positions shown; findings below may reference images not displayed]

FINDINGS: No acute fracture or dislocation. No destructive bone lesion. Mineralization is unremarkable. Vertebral body heights are preserved. No pars defects are seen. Facet alignment is normal. Incidental made of 3 to 4 mm nonobstructing right renal stone.
IMPRESSION: 
IMPRESSION: 1. No acute findings.
2. Minimal degenerative change right L3-4
3. Right renal stone measuring 3 to 4 mm
Is the patient pregnant?
No

## 2022-04-30 IMAGING — MG MAMMO BREAST SCREENING TOMOSYNTHESIS 3D BILATERAL
8 series · 8 of 8 positions shown · non-contrast
Comparison: None

This is a summary report. The complete report is available in the patient's medical record. If you cannot access the medical record, please contact the sending organization for a detailed fax or copy.
FINAL REPORT:
EXAM: MAMMO BREAST SCREENING BILATERAL
CLINICAL HISTORY: Screening mammogram. History of breast reduction surgery
TECHNIQUE: Digital mammography was performed with CAD. Routine views were obtained.

[L MLO (1 of 2)]
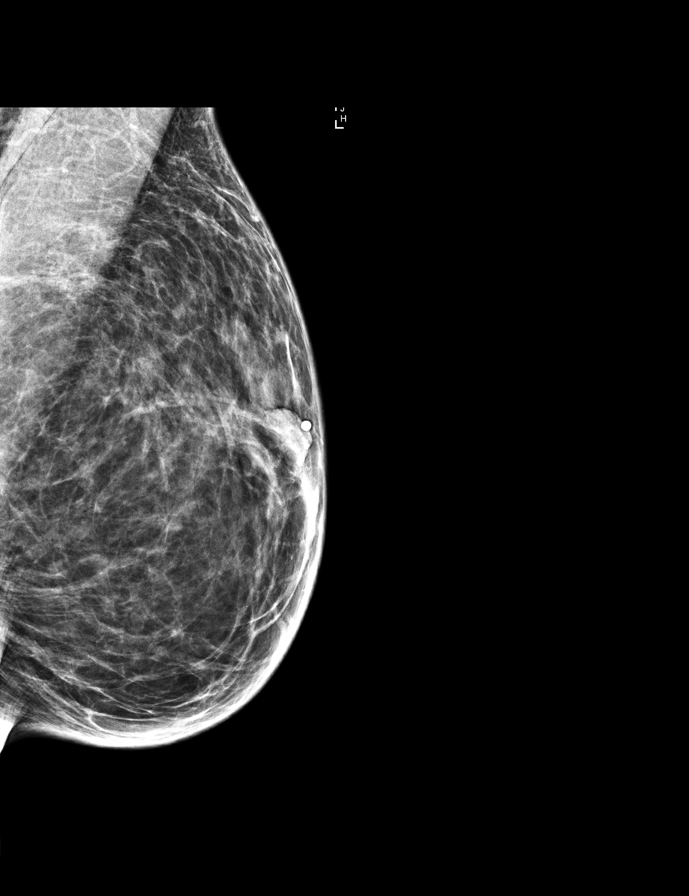

[R XCCL]
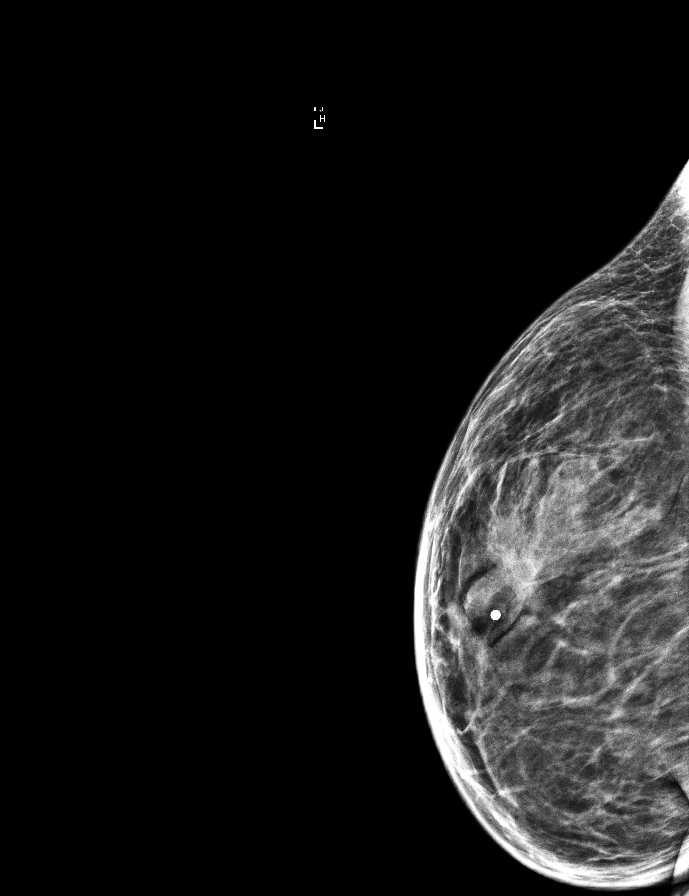

[L XCCL]
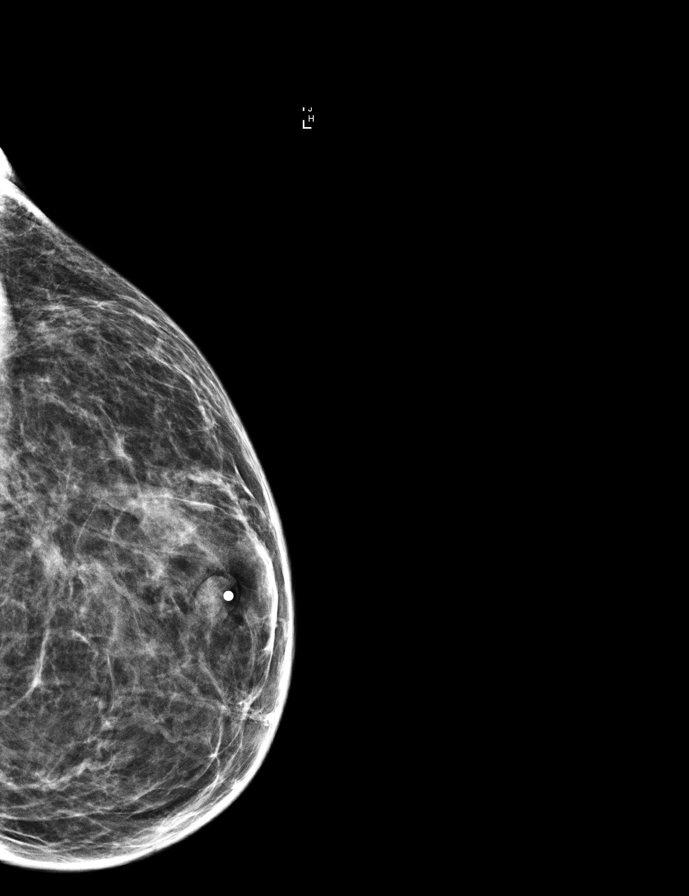

[L MLO (2 of 2)]
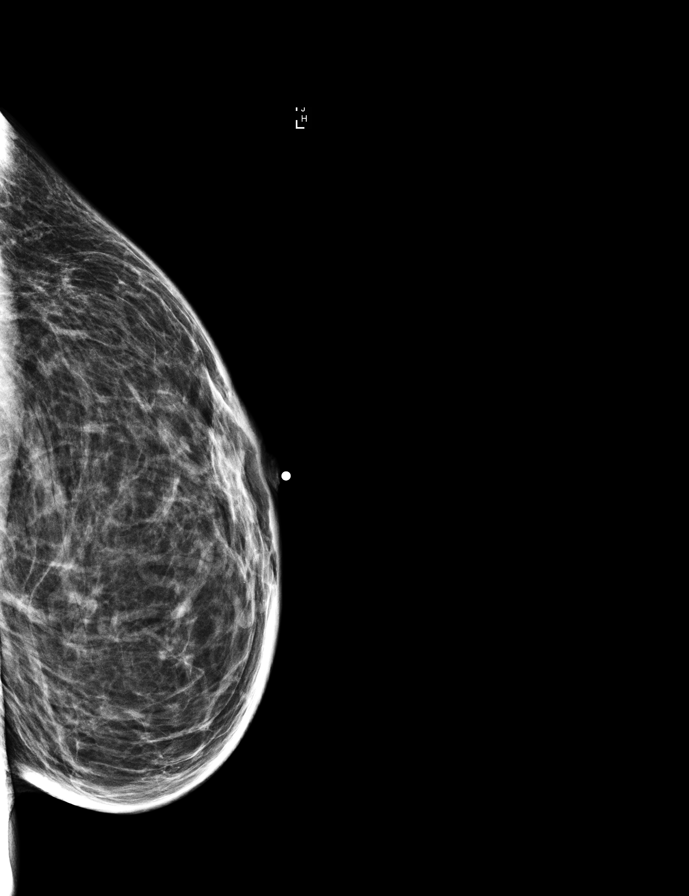

[L CC]
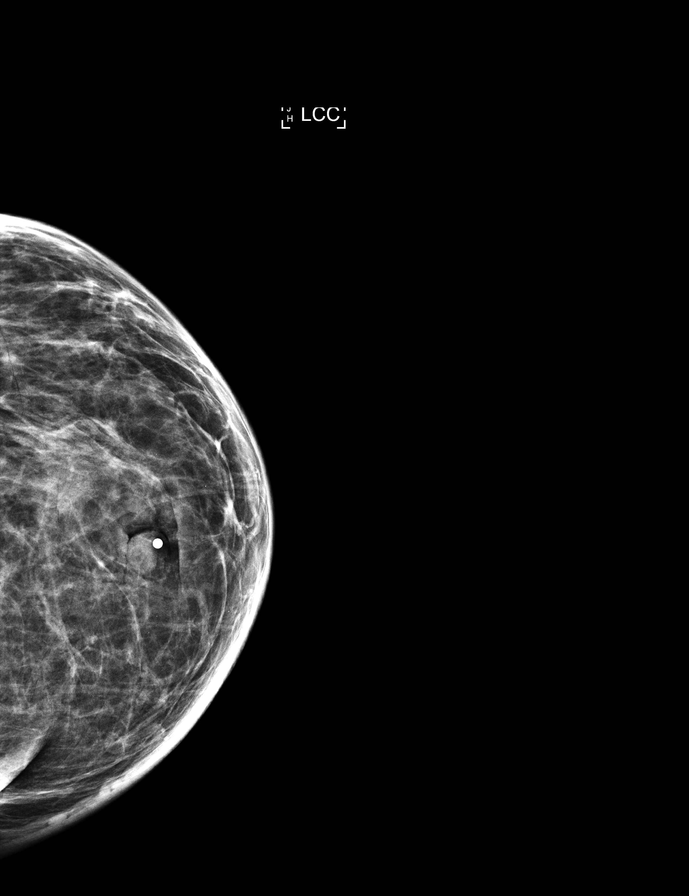

[R CC]
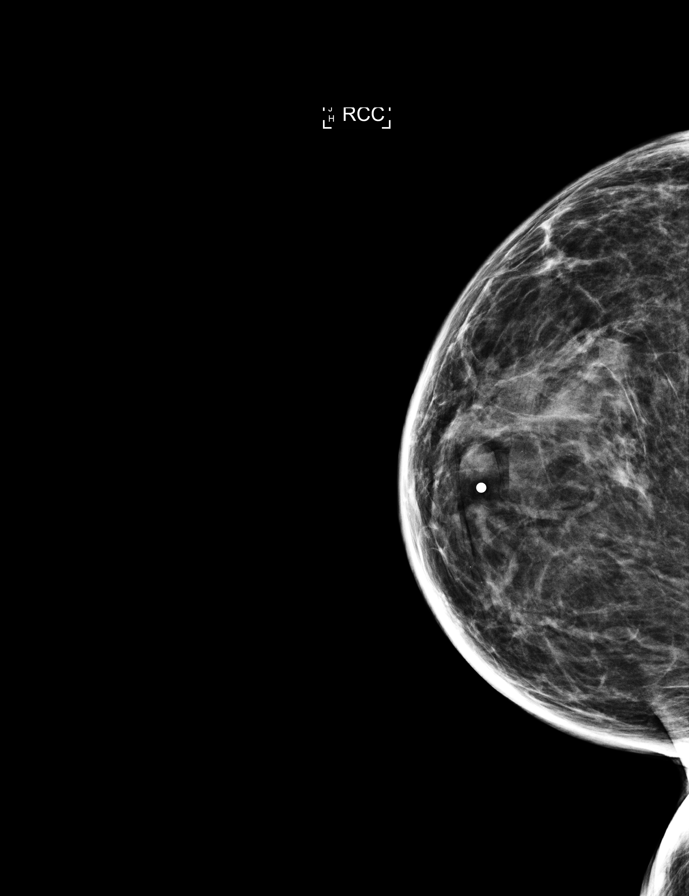

[R MLO (1 of 2)]
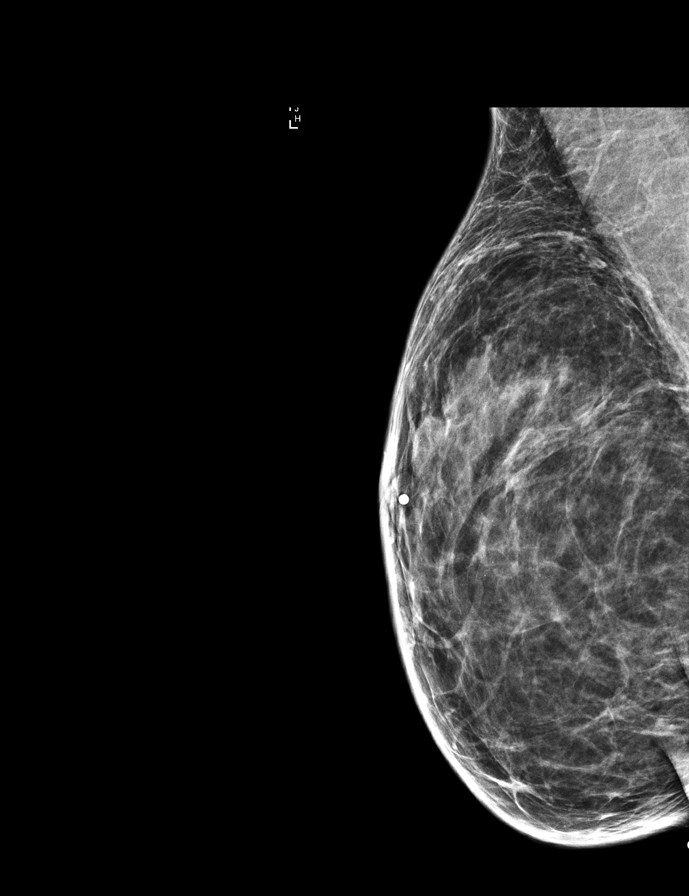

[R MLO (2 of 2)]
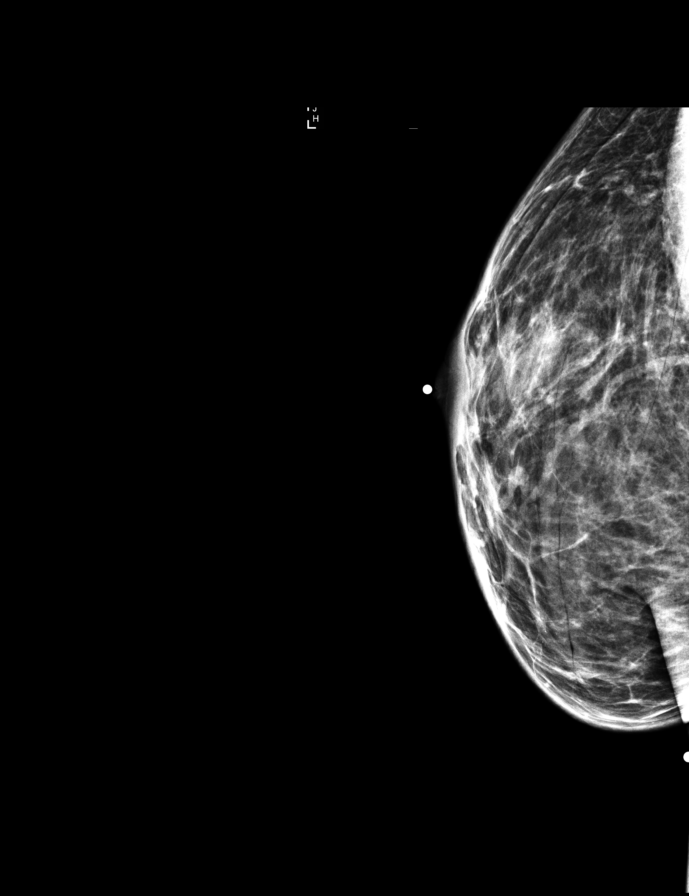

[8 of 8 positions shown; findings below may reference images not displayed]

FINDINGS: No suspicious masses, suspicious microcalcifications, or unexplained distortion. Post reduction changes are noted
IMPRESSION: No evidence of malignancy.
BI-RADS Category 2: Benign
Breast Density C: The breasts are heterogeneously dense, which may obscure small masses
Recommendation: Annual screening
Is the patient pregnant?
No

## 2022-09-01 IMAGING — MR MRI CERVICAL SPINE WITHOUT CONTRAST
8 series · 48 of 48 positions shown · non-contrast
Comparison: None

FINAL REPORT:
EXAM: MR Cervical Spine Without Contrast,
HISTORY: Other cervical disc degeneration, unspecified cervical region
Spinal stenosis, cervical region
TECHNIQUE: Multisequence multiplanar images of the cervical spine were obtained.

[Series 1: survey · coronal · 1.7mm · 1.67mm/px · 19 of 96 slices shown]
[im 1/96]
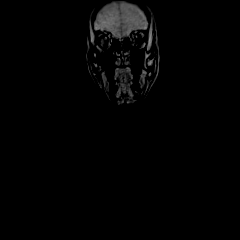
[im 6/96]
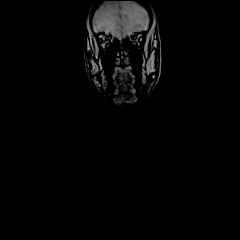
[im 11/96]
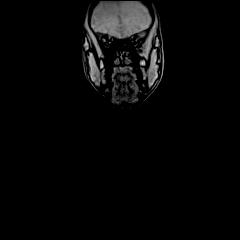
[im 16/96]
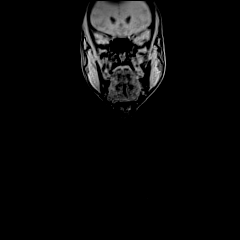
[im 22/96]
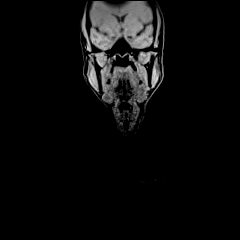
[im 27/96]
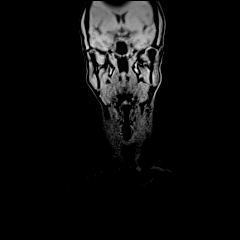
[im 32/96]
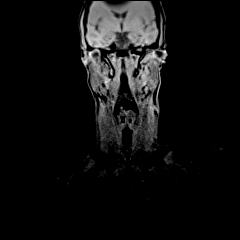
[im 37/96]
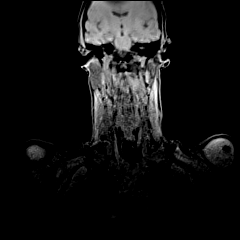
[im 43/96]
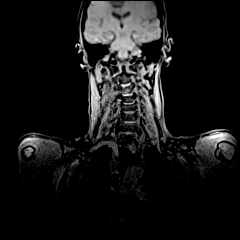
[im 48/96]
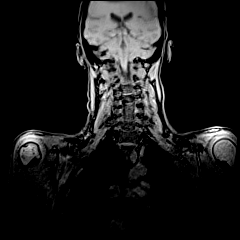
[im 53/96]
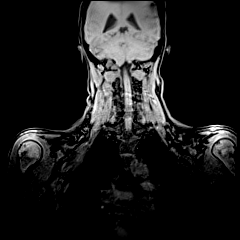
[im 59/96]
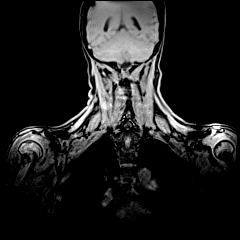
[im 64/96]
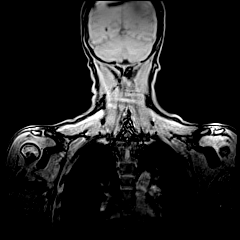
[im 69/96]
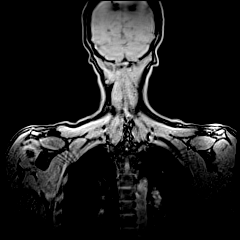
[im 74/96]
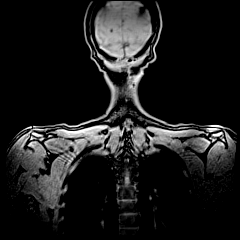
[im 80/96]
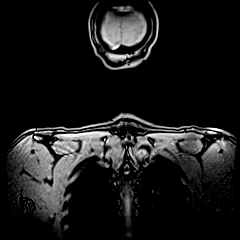
[im 85/96]
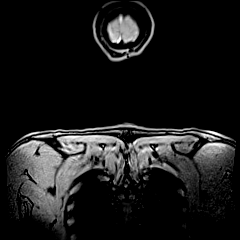
[im 90/96]
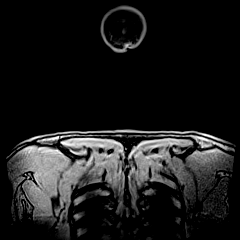
[im 96/96]
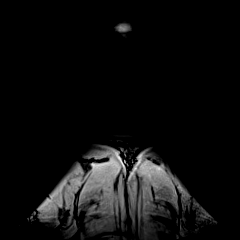

[Series 3: survey_mpr_sag · sagittal · 1.7mm · 1.67mm/px · 3 of 15 slices shown]
[im 1/15]
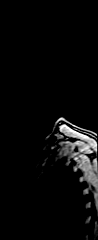
[im 8/15]
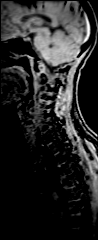
[im 15/15]
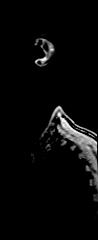

[Series 4: survey_mpr_(person_name) · axial · 1.7mm · 1.67mm/px · 1 of 7 slices shown]
[im 1/7]
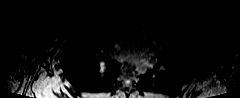

[Series 5: T2 · sagittal · 3.0mm · 0.69mm/px · 3 of 15 slices shown (1 of 2)]
[im 1/15]
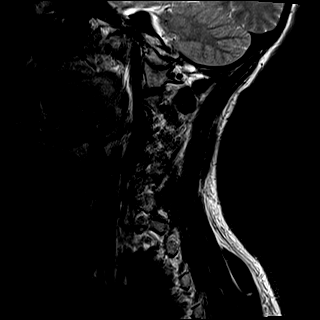
[im 8/15]
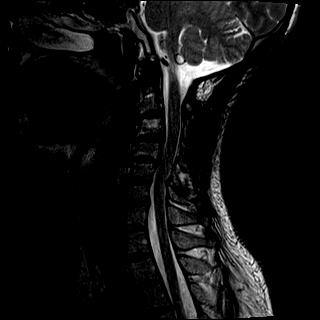
[im 15/15]
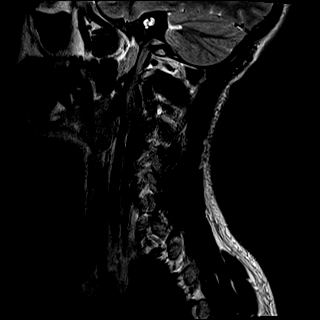

[Series 6: T1 · sagittal · 3.0mm · 0.62mm/px · 3 of 15 slices shown]
[im 1/15]
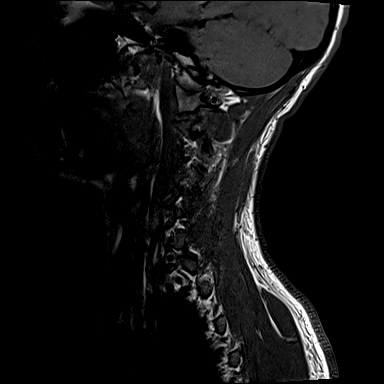
[im 8/15]
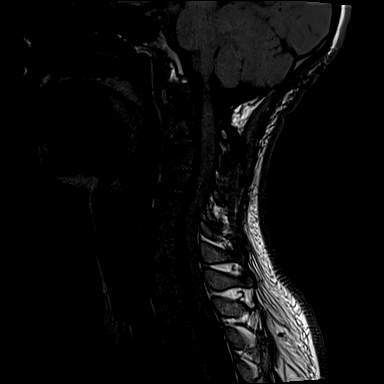
[im 15/15]
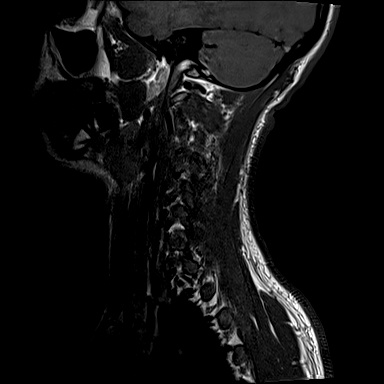

[Series 7: STIR · sagittal · 3.0mm · 0.43mm/px · 3 of 15 slices shown]
[im 1/15]
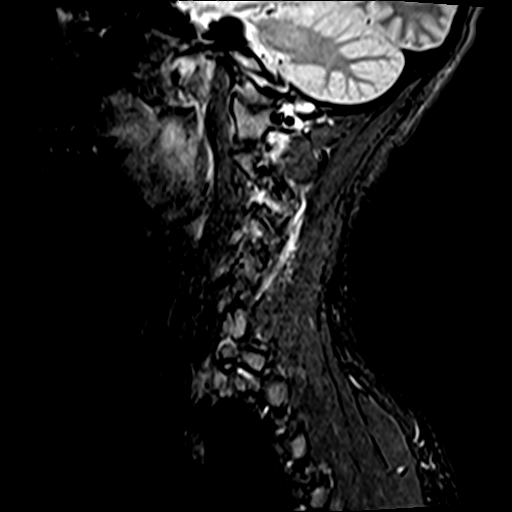
[im 8/15]
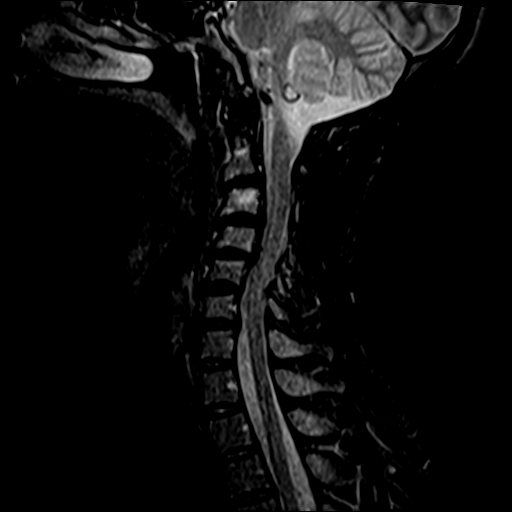
[im 15/15]
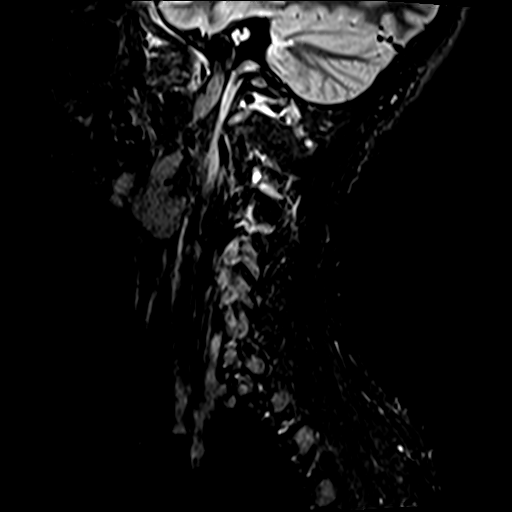

[Series 8: GRE · axial · 4.0mm · 0.94mm/px · z∈[-52,+123]mm · 8 of 41 slices shown]
[im 1/41]
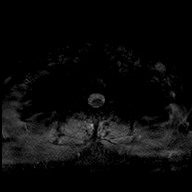
[im 6/41]
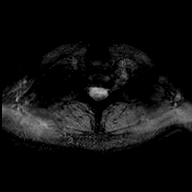
[im 12/41]
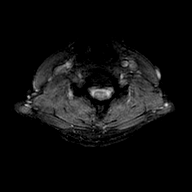
[im 18/41]
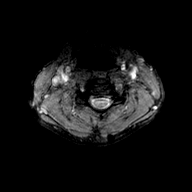
[im 23/41]
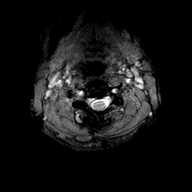
[im 29/41]
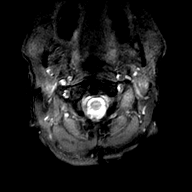
[im 35/41]
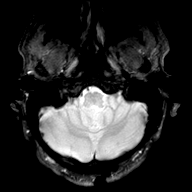
[im 41/41]
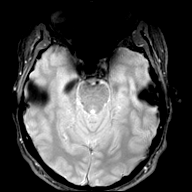

[Series 9: T2 · axial · 3.0mm · 0.70mm/px · z∈[-32,+84]mm · 8 of 40 slices shown (2 of 2)]
[im 1/40]
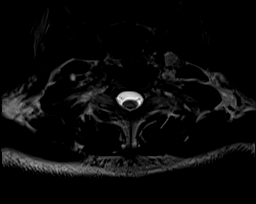
[im 6/40]
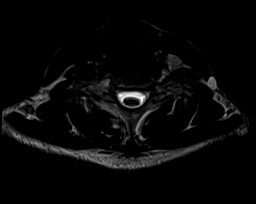
[im 12/40]
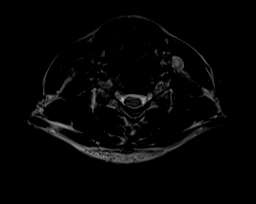
[im 17/40]
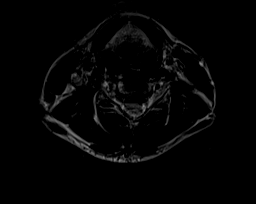
[im 23/40]
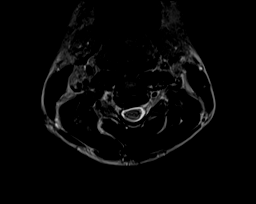
[im 28/40]
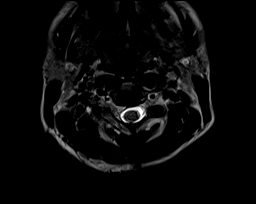
[im 34/40]
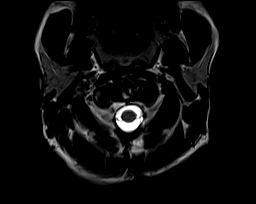
[im 40/40]
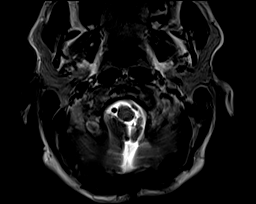

[48 of 48 positions shown; findings below may reference images not displayed]

FINDINGS: Sagittal images extend from C2 through T2. Axial images extend from C2 through T1.
There is reversal of the normal cervical lordosis. Disc height loss is seen at C3-C4, C4-C5, C5-C6. No Chiari malformation. There are type I Modic endplate changes at C2-C3. Disc height loss is also seen at C2-C3. Flow voids are present within the major vessels of the neck. No cervical adenopathy. No retropharyngeal soft tissue swelling. Parotid glands and thyroid glands and centimeter glands are normal.
Vertebral bodies demonstrate normal height and marrow signal. The spinal cord shows no intrinsic signal abnormality. Visible posterior fossa structures are unremarkable, with no cerebellar tonsillar ectopia. Paraspinal soft tissues are unremarkable.
Findings at individual disc levels are as follows:
C2-3: Tiny disc bulge. No neural foraminal or spinal canal stenosis. No facet arthropathy.
C3-C4: There is eccentric right disc osteophyte complex. Mild spinal canal stenosis. Minimal right neural foraminal stenosis. No left neural foraminal stenosis. Minimal facet arthropathy.
C4-C5: There is mild to moderate right neural foraminal stenosis. No left neural foraminal stenosis. Mild facet arthropathy and uncovertebral joint hypertrophy. Small disc osteophyte complex. Mild to moderate spinal canal stenosis.
C5-C6: There is moderate right neural foraminal stenosis. Mild left neural foraminal stenosis. This is due to uncovertebral joint hypertrophy and facet arthropathy. Tiny disc osteophyte complex. No spinal canal stenosis.
C6-C7: Broad-based disc osteophyte complex. No neural foraminal stenosis. Mild facet arthropathy.
C7-T1: Tiny left perineural cyst. No disc bulge. No neural foraminal or spinal canal stenosis. Mild facet arthropathy.
IMPRESSION: Mild to moderate degenerative changes throughout the cervical spine. No definite high-grade neuroforaminal or spinal canal stenoses.
There is reversal of the normal cervical lordosis.
Is the patient pregnant?
Unknown

## 2022-09-12 IMAGING — CR Cervical Spine
4 series · 4 of 4 positions shown · non-contrast
Comparison: Radiographs 11/09/2013.

kyphosis, cervical region
FINAL REPORT:
XR CERVICAL SPINE 2-3 VIEWS
INDICATION: Neck pain.

[left lateral]
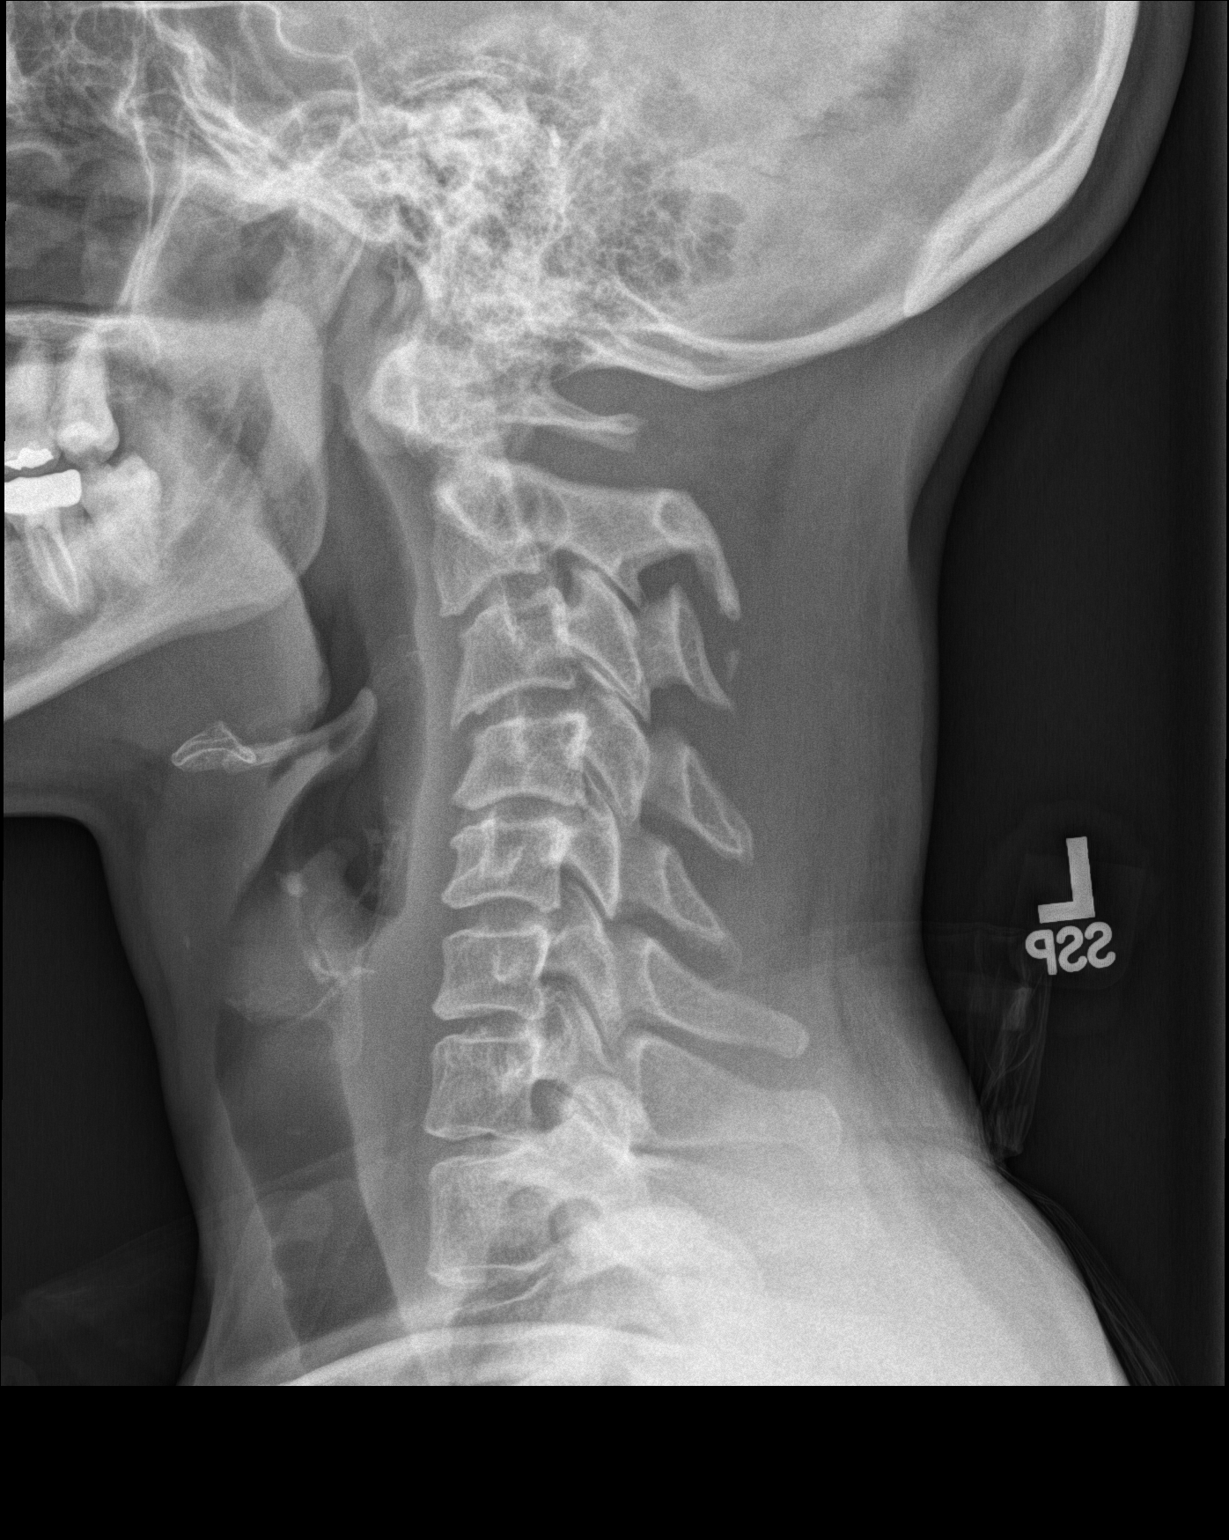

[AP (1 of 3)]
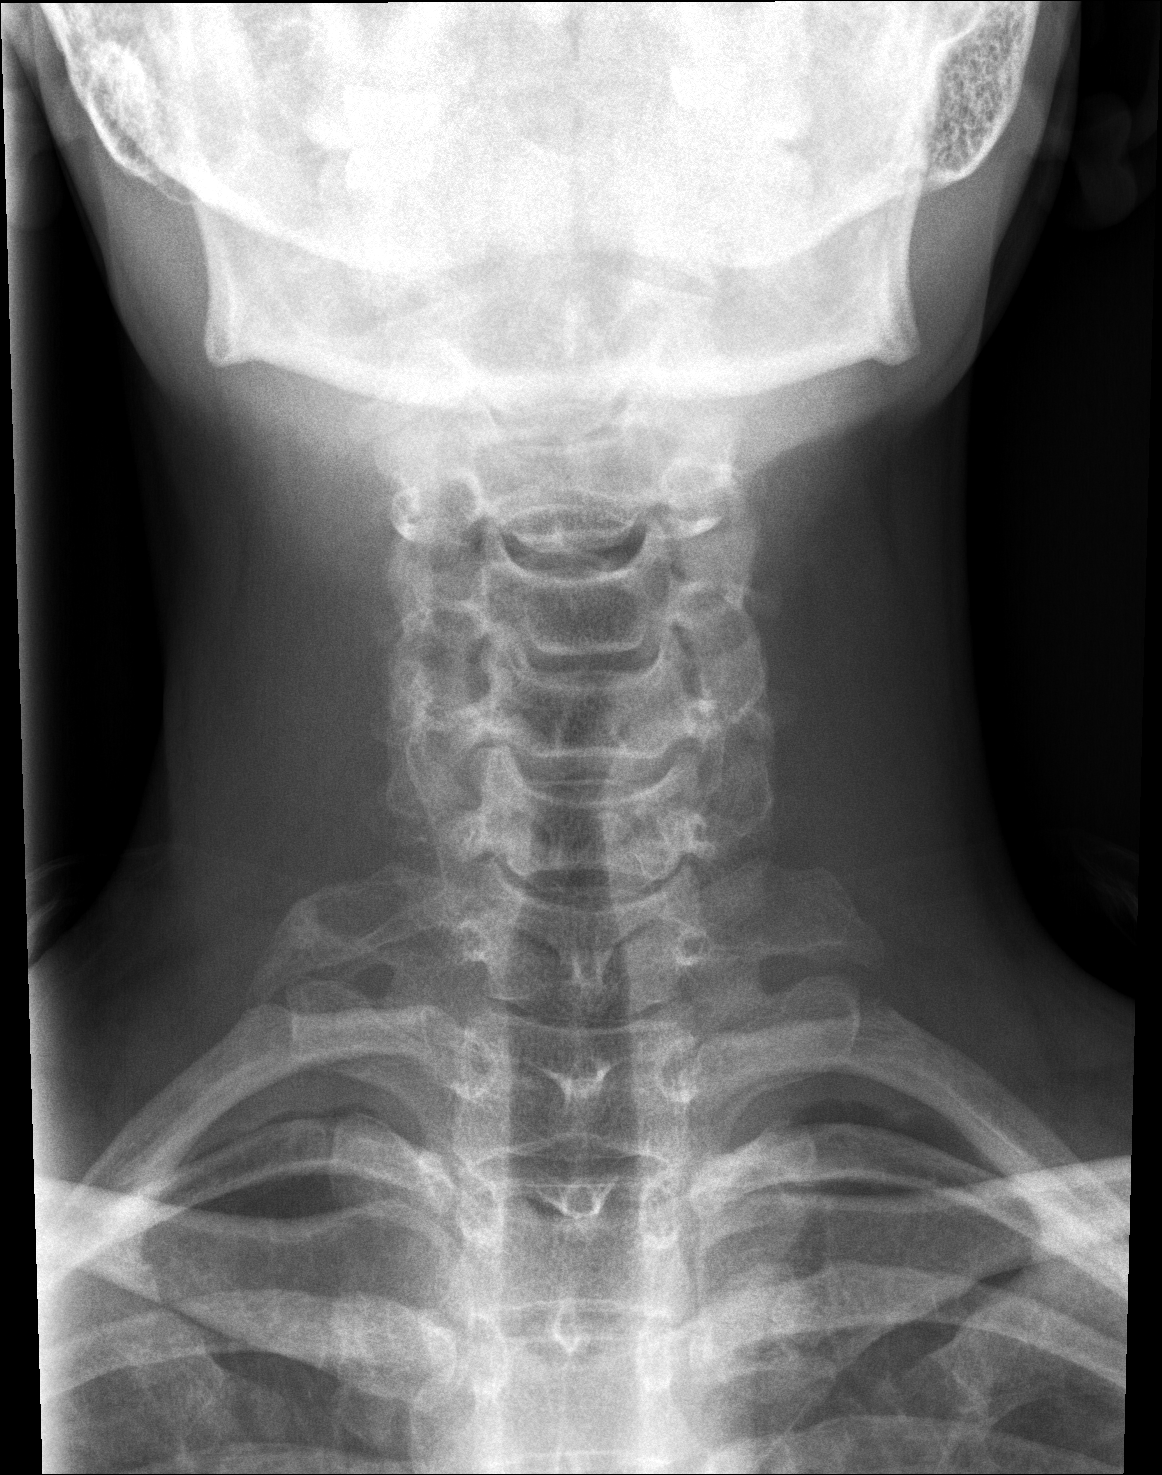

[AP (2 of 3)]
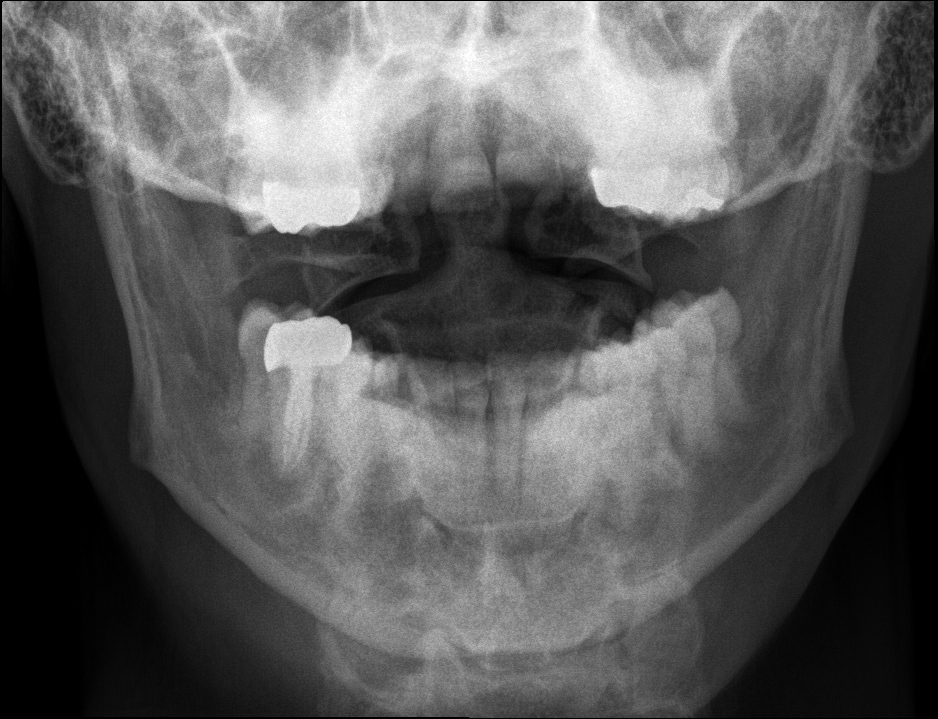

[AP (3 of 3)]
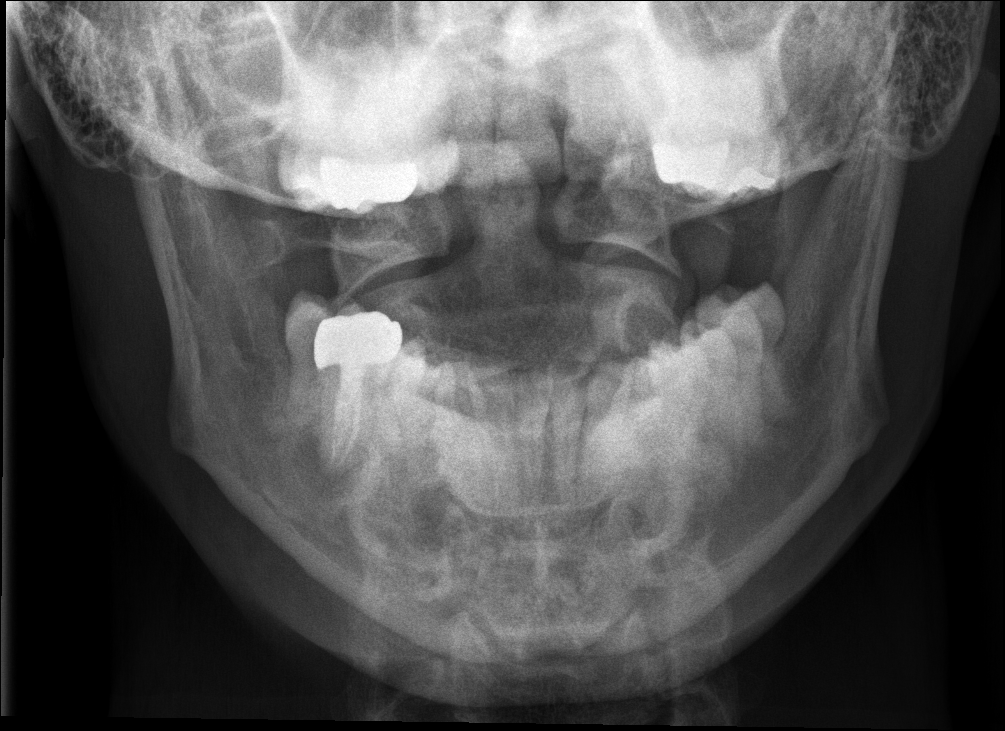

[4 of 4 positions shown; findings below may reference images not displayed]

FINDINGS: 4 views of the cervical spine were obtained.
C1- T1 are seen on the lateral projection. No acute fracture or malalignment.  Vertebral body heights are maintained. Reversal of the normal cervical lordosis. Mild C3-C4 and C4-C5 spondylosis. No significant facet arthrosis. Prevertebral soft tissues are normal.
IMPRESSION: 1. No acute osseous abnormality in the cervical spine.
2. Reversal of the normal cervical lordosis which can be seen in setting of muscle spasm.
Is the patient pregnant?
No

## 2023-03-26 IMAGING — CR XR CHEST 1 VIEW
1 series · 1 of 1 positions shown · non-contrast
Comparison: No relevant priors available for comparison.

FINAL REPORT:
XR CHEST 1 VIEW
CLINICAL INDICATION: post surgical fever

[AP]
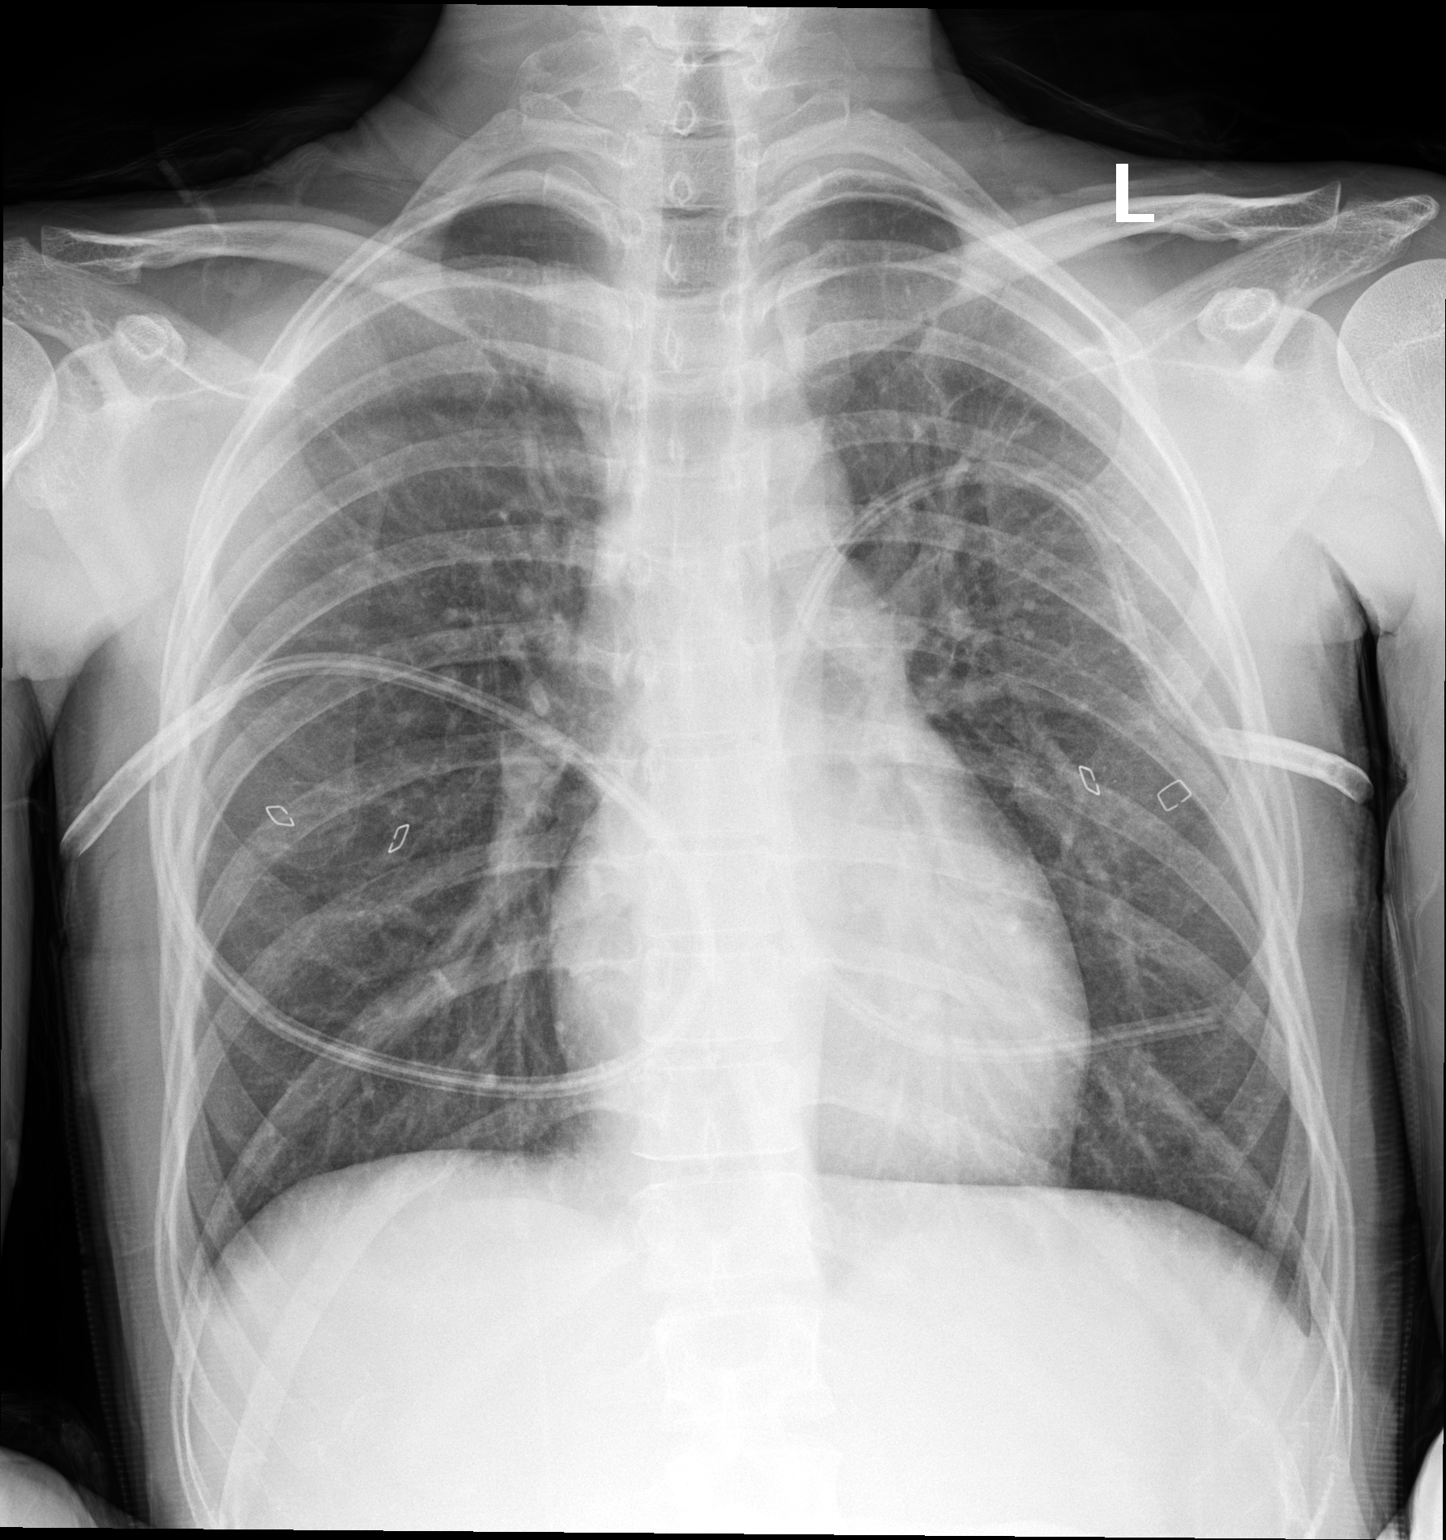

[1 of 1 positions shown; findings below may reference images not displayed]

FINDINGS: Bilateral chest drains.
The cardiomediastinal silhouette is within normal limits for size.
No focal consolidation, pleural effusion or appreciable pneumothorax.
No acute osseous abnormality.
IMPRESSION: No acute finding.
Is the patient pregnant?
No
